# Patient Record
Sex: Female | Born: 2000 | Race: White | Hispanic: No | Marital: Married | Smoking: Never smoker
Health system: Southern US, Community
[De-identification: ages and names within clinical notes are randomized; demographics above are authoritative.]

## PROBLEM LIST (undated history)

## (undated) DIAGNOSIS — G43909 Migraine, unspecified, not intractable, without status migrainosus: Secondary | ICD-10-CM

## (undated) DIAGNOSIS — R112 Nausea with vomiting, unspecified: Secondary | ICD-10-CM

## (undated) DIAGNOSIS — F419 Anxiety disorder, unspecified: Secondary | ICD-10-CM

## (undated) DIAGNOSIS — G90A Postural orthostatic tachycardia syndrome (POTS): Secondary | ICD-10-CM

## (undated) DIAGNOSIS — F32A Depression, unspecified: Secondary | ICD-10-CM

## (undated) DIAGNOSIS — Z9889 Other specified postprocedural states: Secondary | ICD-10-CM

## (undated) HISTORY — PX: ELBOW SURGERY: SHX618

## (undated) HISTORY — PX: ADENOIDECTOMY: SHX5191

## (undated) HISTORY — PX: KNEE SURGERY: SHX244

## (undated) HISTORY — DX: Postural orthostatic tachycardia syndrome (POTS): G90.A

## (undated) HISTORY — PX: TONSILLECTOMY: SUR1361

---
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
## 2019-02-27 DIAGNOSIS — M25362 Other instability, left knee: Secondary | ICD-10-CM | POA: Insufficient documentation

## 2019-04-07 LAB — FETAL NONSTRESS TEST

## 2019-08-19 ENCOUNTER — Encounter: Payer: Medicaid Other | Admitting: Obstetrics and Gynecology

## 2019-10-14 ENCOUNTER — Emergency Department: Payer: Medicaid Other

## 2019-10-14 ENCOUNTER — Emergency Department
Admission: EM | Admit: 2019-10-14 | Discharge: 2019-10-14 | Disposition: A | Discharge status: Home / Self Care | Payer: Medicaid Other | Attending: Emergency Medicine | Admitting: Emergency Medicine

## 2019-10-14 ENCOUNTER — Encounter: Payer: Self-pay | Admitting: Emergency Medicine

## 2019-10-14 ENCOUNTER — Other Ambulatory Visit: Payer: Self-pay

## 2019-10-14 DIAGNOSIS — O218 Other vomiting complicating pregnancy: Secondary | ICD-10-CM | POA: Diagnosis not present

## 2019-10-14 DIAGNOSIS — O368390 Maternal care for abnormalities of the fetal heart rate or rhythm, unspecified trimester, not applicable or unspecified: Secondary | ICD-10-CM

## 2019-10-14 DIAGNOSIS — Z7722 Contact with and (suspected) exposure to environmental tobacco smoke (acute) (chronic): Secondary | ICD-10-CM | POA: Insufficient documentation

## 2019-10-14 DIAGNOSIS — Z3A01 Less than 8 weeks gestation of pregnancy: Secondary | ICD-10-CM | POA: Insufficient documentation

## 2019-10-14 DIAGNOSIS — O26899 Other specified pregnancy related conditions, unspecified trimester: Secondary | ICD-10-CM

## 2019-10-14 DIAGNOSIS — O26891 Other specified pregnancy related conditions, first trimester: Secondary | ICD-10-CM | POA: Diagnosis present

## 2019-10-14 DIAGNOSIS — Z79899 Other long term (current) drug therapy: Secondary | ICD-10-CM | POA: Insufficient documentation

## 2019-10-14 DIAGNOSIS — R112 Nausea with vomiting, unspecified: Secondary | ICD-10-CM

## 2019-10-14 LAB — COMPREHENSIVE METABOLIC PANEL
ALT: 20 U/L (ref 0–44)
AST: 23 U/L (ref 15–41)
Albumin: 4.9 g/dL (ref 3.5–5.0)
Alkaline Phosphatase: 71 U/L (ref 38–126)
Anion gap: 10 (ref 5–15)
BUN: 9 mg/dL (ref 6–20)
CO2: 23 mmol/L (ref 22–32)
Calcium: 9.5 mg/dL (ref 8.9–10.3)
Chloride: 103 mmol/L (ref 98–111)
Creatinine, Ser: 0.63 mg/dL (ref 0.44–1.00)
GFR calc Af Amer: >60 mL/min (ref >60)
GFR calc non Af Amer: >60 mL/min (ref >60)
Glucose, Bld: 98 mg/dL (ref 70–99)
Potassium: 3.4 mmol/L — ABNORMAL LOW (ref 3.5–5.1)
Sodium: 136 mmol/L (ref 135–145)
Total Bilirubin: 1 mg/dL (ref 0.3–1.2)
Total Protein: 7.6 g/dL (ref 6.5–8.1)

## 2019-10-14 LAB — CBC
HCT: 40.5 % (ref 36.0–46.0)
Hemoglobin: 13.6 g/dL (ref 12.0–15.0)
MCH: 30 pg (ref 26.0–34.0)
MCHC: 33.6 g/dL (ref 30.0–36.0)
MCV: 89.2 fL (ref 80.0–100.0)
Platelets: 269 10*3/uL (ref 150–400)
RBC: 4.54 MIL/uL (ref 3.87–5.11)
RDW: 12.5 % (ref 11.5–15.5)
WBC: 9.7 10*3/uL (ref 4.0–10.5)
nRBC: 0 % (ref 0.0–0.2)

## 2019-10-14 LAB — URINALYSIS, COMPLETE (UACMP) WITH MICROSCOPIC
Bilirubin Urine: NEGATIVE
Glucose, UA: NEGATIVE mg/dL
Ketones, ur: 80 mg/dL — AB
Leukocytes,Ua: NEGATIVE
Nitrite: NEGATIVE
Protein, ur: NEGATIVE mg/dL
Specific Gravity, Urine: 1.019 (ref 1.005–1.030)
pH: 8 (ref 5.0–8.0)

## 2019-10-14 LAB — HCG, QUANTITATIVE, PREGNANCY: hCG, Beta Chain, Quant, S: 44758 m[IU]/mL — ABNORMAL HIGH (ref <5)

## 2019-10-14 LAB — LIPASE, BLOOD: Lipase: 29 U/L (ref 11–51)

## 2019-10-14 LAB — POCT PREGNANCY, URINE: Preg Test, Ur: POSITIVE — AB

## 2019-10-14 MED ORDER — METOCLOPRAMIDE HCL 10 MG PO TABS: 10.0000 mg | ORAL_TABLET | Freq: Four times a day (QID) | ORAL | 1 refills | Status: DC | PRN

## 2019-10-14 MED ORDER — ACETAMINOPHEN 500 MG PO TABS
1000.0000 mg | ORAL_TABLET | Freq: Once | ORAL | Status: DC
  Filled 2019-10-14: qty 2

## 2019-10-14 NOTE — ED Triage Notes (Signed)
Pt recently found out she was pregnant. Virtual visit with primary care and was dx with stomach bug. Sent her by [facility] for a work up. As she has not been seen by them.

## 2019-10-14 NOTE — ED Provider Notes (Signed)
[facility] Emergency Department Provider Note ____________________________________________   First MD Initiated Contact with Patient 10/14/19 1520     (approximate)  I have reviewed the triage vital signs and the nursing notes.  HISTORY  Chief Complaint Abdominal Pain (Pregnacy related Nausea and vomitiing) and Nausea   HPI Adriana Kerr is a 19 y.o. femalewho presents to the ED for evaluation of nausea and vomiting of pregnancy.  Chart review indicates no relevant medical history. Patient reports she is a G3 P0 at about [redacted] weeks gestation.  Reports her previous to the gestations ended with a spontaneous abortion at less than [redacted] weeks gestation.  Patient reports her LMP was early July and that she has had multiple positive home pregnancy tests over the past 1 week. Patient has not seen OB/GYN or gotten an ultrasound during this gestation yet.  Patient reports about 1-1.5 weeks of poor p.o. intake and morning sickness.  She reports extreme nausea and occasional vomiting of nonbloody nonbilious emesis, up to five episodes in the morning, each morning for the past 1 week.  She reports difficulty keeping anything down throughout the morning, but feeling better each afternoon.  She denies any abdominal pain, diarrhea, vaginal bleeding, dysuria, vaginal discharge, syncope, fevers.   Patient reports trying pyridoxine/vitamin B6 with minimal improvement of her nausea/vomiting.  She reports inability to take Unisom due to it making her too sleepy, similarly Phenergan makes her too sleepy to do her job.    Past Medical History:  Diagnosis Date  . Migraines     There are no problems to display for this patient.   Past Surgical History:  Procedure Laterality Date  . ADENOIDECTOMY    . ELBOW SURGERY    . KNEE SURGERY    . TONSILLECTOMY      Prior to Admission medications   Medication Sig Start Date End Date Taking? Authorizing Provider   brompheniramine-pseudoephedrine-DM 30-2-10 MG/5ML syrup Take 5 mLs by mouth 4 (four) times daily as needed. 02/04/16   Joni Reining, PA-C  hydrOXYzine (VISTARIL) 25 MG capsule Take 1 capsule (25 mg total) by mouth 3 (three) times daily as needed. 11/11/15   Beers, Charmayne Sheer, PA-C  metoCLOPramide (REGLAN) 10 MG tablet Take 1 tablet (10 mg total) by mouth every 6 (six) hours as needed for nausea or vomiting. 10/14/19 11/13/19  Delton Prairie, MD  predniSONE (DELTASONE) 10 MG tablet Take 1 tablet (10 mg total) by mouth daily. 6,5,4,3,2,1 six day taper 04/22/16   Evon Slack, PA-C  predniSONE (STERAPRED UNI-PAK 21 TAB) 10 MG (21) TBPK tablet Take by mouth daily. Take 6 tabs by mouth daily  for 2 days, then 5 tabs for 2 days, then 4 tabs for 2 days, then 3 tabs for 2 days, 2 tabs for 2 days, then 1 tab by mouth daily for 2 days 09/01/16   Lavera Guise, MD    Allergies Flexeril [cyclobenzaprine], Amoxicillin, Tylenol with codeine #3 [acetaminophen-codeine], and Augmentin [amoxicillin-pot clavulanate]  History reviewed. No pertinent family history.  Social History Social History   Tobacco Use  . Smoking status: Passive Smoke Exposure - Never Smoker  . Smokeless tobacco: Never Used  Substance Use Topics  . Alcohol use: No  . Drug use: Never    Review of Systems  Constitutional: No fever/chills Eyes: No visual changes. ENT: No sore throat. Cardiovascular: Denies chest pain. Respiratory: Denies shortness of breath. Gastrointestinal: No abdominal pain.  No diarrhea.  No constipation.  Positive for  nausea and vomiting. Genitourinary: Negative for dysuria. Musculoskeletal: Negative for back pain. Skin: Negative for rash. Neurological: Negative for headaches, focal weakness or numbness.   ____________________________________________   PHYSICAL EXAM:  VITAL SIGNS: Vitals:   10/14/19 1452 10/14/19 1727  BP: 126/76 123/78  Pulse: 84 79  Resp: 20 18  Temp:    SpO2: 99% 100%       Constitutional: Alert and oriented. Well appearing and in no acute distress. Eyes: Conjunctivae are normal. PERRL. EOMI. Head: Atraumatic. Nose: No congestion/rhinnorhea. Mouth/Throat: Mucous membranes are moist.  Oropharynx non-erythematous. Neck: No stridor. No cervical spine tenderness to palpation. Cardiovascular: Normal rate, regular rhythm. Grossly normal heart sounds.  Good peripheral circulation. Respiratory: Normal respiratory effort.  No retractions. Lungs CTAB. Gastrointestinal: Soft , nondistended, nontender to palpation. No abdominal bruits. No CVA tenderness. Musculoskeletal: No lower extremity tenderness nor edema.  No joint effusions. No signs of acute trauma. Neurologic:  Normal speech and language. No gross focal neurologic deficits are appreciated. No gait instability noted. Skin:  Skin is warm, dry and intact. No rash noted. Psychiatric: Mood and affect are normal. Speech and behavior are normal.  ____________________________________________   LABS (all labs ordered are listed, but only abnormal results are displayed)  Labs Reviewed  COMPREHENSIVE METABOLIC PANEL - Abnormal; Notable for the following components:      Result Value   Potassium 3.4 (*)    All other components within normal limits  URINALYSIS, COMPLETE (UACMP) WITH MICROSCOPIC - Abnormal; Notable for the following components:   Color, Urine YELLOW (*)    APPearance CLOUDY (*)    Hgb urine dipstick SMALL (*)    Ketones, ur 80 (*)    Bacteria, UA RARE (*)    All other components within normal limits  HCG, QUANTITATIVE, PREGNANCY - Abnormal; Notable for the following components:   hCG, Beta Chain, Quant, S 44,758 (*)    All other components within normal limits  POCT PREGNANCY, URINE - Abnormal; Notable for the following components:   Preg Test, Ur POSITIVE (*)    All other components within normal limits  LIPASE, BLOOD  CBC  POC URINE PREG, ED     ____________________________________________  RADIOLOGY  ED MD interpretation:    Official radiology report(s): US OB LESS THAN 14 WEEKS WITH OB TRANSVAGINAL  Result Date: 10/14/2019 CLINICAL DATA:  Abdominal pain EXAM: OBSTETRIC <14 WK [place] AND TRANSVAGINAL OB US TECHNIQUE: Both transabdominal and transvaginal ultrasound examinations were performed for complete evaluation of the gestation as well as the maternal uterus, adnexal regions, and pelvic cul-de-sac. Transvaginal technique was performed to assess early pregnancy. COMPARISON:  None. FINDINGS: Intrauterine gestational sac: Single Yolk sac:  Visualized Embryo:  Visualized. Cardiac Activity: Visualized. Heart Rate: 109 bpm MSD:   mm    w     d CRL:  5.1 mm   6 w   2 d                  [place] EDC: 06/06/2020 Subchorionic hemorrhage:  None visualized. Maternal uterus/adnexae: No adnexal mass or free fluid. IMPRESSION: Six week 2 day intrauterine pregnancy. Fetal heart rate 109 beats per minute. No acute maternal findings. Electronically Signed   By: Charlett Nose M.D.   On: 10/14/2019 17:02    ____________________________________________   PROCEDURES and INTERVENTIONS  Procedure(s) performed (including Critical Care):  Procedures  Medications  acetaminophen (TYLENOL) tablet 1,000 mg (1,000 mg Oral Refused 10/14/19 1605)    ____________________________________________   MDM / ED COURSE  19 year old female G3, P0 at about [redacted] weeks gestation presenting with nausea/vomiting of her first trimester, found to have a bradycardic fetus, and ultimately amenable to outpatient management.  Normal vital signs on room air.  Exam reassuring without evidence of acute pathology.  She is well-appearing without distress and has a benign abdominal exam.  Blood work reassuring with appropriately elevated beta quant, and otherwise without significant derangements.  TVUS demonstrates an IUP, though unfortunately the heart rate is 109 bpm.  Educated the  patient that fetal bradycardia is not reassuring and this may mean that this gestation will not be viable, but that the fetus is appropriately positioned and that the patient looks well.  We discussed the importance of OB/GYN follow-up and we discussed return precautions for the ED that may suggest a possible miscarriage, and she expresses understanding and agreement.  She follows up with her OB/GYN next week to establish care.  Patient medically stable for discharge home. ____________________________________________   FINAL CLINICAL IMPRESSION(S) / ED DIAGNOSES  Final diagnoses:  Less than [redacted] weeks gestation of pregnancy  Non-intractable vomiting with nausea, unspecified vomiting type  Fetal bradycardia, antepartum condition or complication     ED Discharge Orders         Ordered    metoCLOPramide (REGLAN) 10 MG tablet  Every 6 hours PRN        10/14/19 1721           Koraline Phillipson   Note:  This document was prepared using Conservation officer, historic buildings and may include unintentional dictation errors.   Delton Prairie, MD 10/14/19 [id number]

## 2019-10-14 NOTE — Discharge Instructions (Signed)
You are being prescribed a medication called Reglan to help with your nausea.  This medication is safe in pregnancy.  Take 1 tablet by mouth at a time, up to 4 times per day as needed for nausea.  It would also be safe to add Unisom/vitamin B6 (pyridoxine).  Unisom is often marketed as a sleep aid medication.  Please follow-up with your OB/GYN doctor. The ultrasound we did here of baby demonstrates a relatively slow heart rate at 110 beats per minutes.  While it is still very early, this may represent an early gestation that is not viable.  If you develop any vaginal bleeding, fevers or worsening pain, please return to the ED

## 2020-01-26 ENCOUNTER — Ambulatory Visit
Admission: RE | Admit: 2020-01-26 | Discharge: 2020-01-26 | Disposition: A | Discharge status: Home / Self Care | Payer: Medicaid Other | Source: Ambulatory Visit | Attending: Obstetrics and Gynecology | Admitting: Obstetrics and Gynecology

## 2020-01-26 ENCOUNTER — Other Ambulatory Visit: Payer: Self-pay

## 2020-01-26 ENCOUNTER — Other Ambulatory Visit: Payer: Self-pay | Admitting: Obstetrics and Gynecology

## 2020-01-26 DIAGNOSIS — Z3A00 Weeks of gestation of pregnancy not specified: Secondary | ICD-10-CM | POA: Insufficient documentation

## 2020-01-26 DIAGNOSIS — O210 Mild hyperemesis gravidarum: Secondary | ICD-10-CM | POA: Insufficient documentation

## 2020-01-26 MED ORDER — ONDANSETRON HCL 4 MG/2ML IJ SOLN
INTRAMUSCULAR | Status: AC
  Administered 2020-01-26: 4 mg via INTRAVENOUS
  Filled 2020-01-26: qty 2

## 2020-01-26 MED ORDER — SODIUM CHLORIDE 0.9 % IV SOLN
INTRAVENOUS | Status: DC | PRN
  Administered 2020-01-26: 500 mL via INTRAVENOUS

## 2020-01-26 MED ORDER — THIAMINE HCL 100 MG/ML IJ SOLN
Freq: Once | INTRAVENOUS | Status: AC
  Filled 2020-01-26: qty 1000

## 2020-01-26 MED ORDER — ONDANSETRON HCL 4 MG/2ML IJ SOLN: 4.0000 mg | Freq: Once | INTRAMUSCULAR | Status: AC

## 2020-01-26 NOTE — Progress Notes (Signed)
Pt with HG at 21wks, to SDS for IV hydration and antiemetic today

## 2020-01-29 ENCOUNTER — Observation Stay
Admission: EM | Admit: 2020-01-29 | Discharge: 2020-01-29 | Disposition: A | Discharge status: Home / Self Care | Payer: Medicaid Other | Attending: Obstetrics and Gynecology | Admitting: Obstetrics and Gynecology

## 2020-01-29 ENCOUNTER — Other Ambulatory Visit: Payer: Self-pay

## 2020-01-29 DIAGNOSIS — Z3490 Encounter for supervision of normal pregnancy, unspecified, unspecified trimester: Secondary | ICD-10-CM

## 2020-01-29 DIAGNOSIS — Z3A22 22 weeks gestation of pregnancy: Secondary | ICD-10-CM | POA: Insufficient documentation

## 2020-01-29 DIAGNOSIS — O368120 Decreased fetal movements, second trimester, not applicable or unspecified: Principal | ICD-10-CM | POA: Insufficient documentation

## 2020-01-29 DIAGNOSIS — O0993 Supervision of high risk pregnancy, unspecified, third trimester: Secondary | ICD-10-CM

## 2020-01-29 DIAGNOSIS — Z3482 Encounter for supervision of other normal pregnancy, second trimester: Secondary | ICD-10-CM

## 2020-01-29 HISTORY — DX: Anxiety disorder, unspecified: F41.9

## 2020-01-29 HISTORY — DX: Nausea with vomiting, unspecified: R11.2

## 2020-01-29 HISTORY — DX: Other specified postprocedural states: Z98.890

## 2020-01-29 NOTE — Discharge Summary (Signed)
Triage Visit with NST    Adriana Kerr is a 19 y.o. G1P0. She is at [redacted]w[redacted]d gestation.  Indication: Decreased fetal movement, no movement today when she usually does feel it, and nothing she can do has been able to make it better  S: Resting comfortably. no CTX, no VB.    Ht 5\' 3"  (1.6 m)   Wt 68.9 kg   LMP 08/26/2019   BMI 26.93 kg/m  No results found for this or any previous visit (from the past 48 hour(s)).   Gen: NAD, AAOx3      Abd: FNTTP      Ext: Non-tender, Nonedmeatous    FHT: 150 TOCO: quiet SVE: deferred  Beside ultrasound: active fetal movement, heartbeat noted. Placenta in anterior lower uterine segment, normal fluid subjectively, DVP 6cm   A/P:  19 y.o. G1P0 [redacted]w[redacted]d with concerns for decreased fetal movement, now resolved.   Labor: not present.   Fetal Wellbeing: Doppler and bedside ultrasound are Reassuring   D/c home stable, precautions reviewed, follow-up as scheduled.

## 2020-01-29 NOTE — OB Triage Note (Signed)
Pt Adriana Kerr 19 y.o. presents to the ED complaining of decreased fetal movement. Pt is a G1P0 at [redacted]w[redacted]d . Pt denies signs and symptons consistent with rupture of membranes or active vaginal bleeding. Pt denies contractions and states concern regarding fetal movement. External TOCO applied to non-tender abdomen and assessing. Initial FHR is 150. Vital signs obtained and within normal limits. Provider notified of pt.

## 2020-02-17 NOTE — Addendum Note (Signed)
Encounter addended by: Rosalita Chessman, RN on: 02/17/2020 2:28 PM  Actions taken: Charge Capture section accepted

## 2020-02-21 NOTE — L&D Delivery Note (Signed)
Date of delivery: 05/17/2020 Estimated Date of Delivery: 06/06/20 Patient's last menstrual period was 08/26/2019. EGA: [redacted]w[redacted]d  Delivery Note At 1:11 PM a viable female was delivered via Vaginal, Spontaneous Presentation: Right Occiput Anterior   APGAR: 9, 9; Weight: 2830 g, 6 pounds 3.8 ounces  Placenta status: Spontaneous, Intact.   Cord: 3 vessels with the following complications: marginal insertion  Cord pH: NA  Mom pushed well to deliver a viable female infant.  The head followed by shoulders, which delivered without difficulty, and the rest of the body.  No nuchal cord noted.  Baby to mom's chest.  Cord clamped and cut after 8 min delay.  No cord blood obtained.  Placenta delivered spontaneously, intact, with a 3-vessel cord.   All counts correct.  Hemostasis obtained with IV pitocin and fundal massage.   Anesthesia: Epidural Episiotomy: None Lacerations: None Suture Repair: NA Est. Blood Loss (mL): 300  Mom to postpartum.  Baby to Couplet care / Skin to Skin.  Tresea Mall, CNM 05/17/2020, 1:53 PM

## 2020-02-22 ENCOUNTER — Other Ambulatory Visit: Payer: Self-pay

## 2020-02-22 ENCOUNTER — Emergency Department
Admission: EM | Admit: 2020-02-22 | Discharge: 2020-02-22 | Discharge status: Absent without leave | Payer: Medicaid Other

## 2020-02-22 DIAGNOSIS — O210 Mild hyperemesis gravidarum: Secondary | ICD-10-CM

## 2020-02-24 ENCOUNTER — Observation Stay
Admission: EM | Admit: 2020-02-24 | Discharge: 2020-02-24 | Disposition: A | Discharge status: Home / Self Care | Payer: Medicaid Other | Attending: Obstetrics and Gynecology | Admitting: Obstetrics and Gynecology

## 2020-02-24 ENCOUNTER — Other Ambulatory Visit: Payer: Self-pay

## 2020-02-24 ENCOUNTER — Encounter: Payer: Self-pay | Admitting: Obstetrics and Gynecology

## 2020-02-24 ENCOUNTER — Ambulatory Visit (INDEPENDENT_AMBULATORY_CARE_PROVIDER_SITE_OTHER): Payer: Medicaid Other | Admitting: Obstetrics and Gynecology

## 2020-02-24 VITALS — BP 112/70 | Ht 63.0 in | Wt 152.4 lb

## 2020-02-24 DIAGNOSIS — O219 Vomiting of pregnancy, unspecified: Secondary | ICD-10-CM | POA: Diagnosis present

## 2020-02-24 DIAGNOSIS — Z3A25 25 weeks gestation of pregnancy: Secondary | ICD-10-CM

## 2020-02-24 DIAGNOSIS — O210 Mild hyperemesis gravidarum: Secondary | ICD-10-CM | POA: Diagnosis not present

## 2020-02-24 DIAGNOSIS — O99612 Diseases of the digestive system complicating pregnancy, second trimester: Secondary | ICD-10-CM

## 2020-02-24 DIAGNOSIS — Z3A26 26 weeks gestation of pregnancy: Secondary | ICD-10-CM

## 2020-02-24 DIAGNOSIS — Z3482 Encounter for supervision of other normal pregnancy, second trimester: Secondary | ICD-10-CM

## 2020-02-24 DIAGNOSIS — K920 Hematemesis: Secondary | ICD-10-CM | POA: Diagnosis not present

## 2020-02-24 DIAGNOSIS — O4402 Placenta previa specified as without hemorrhage, second trimester: Secondary | ICD-10-CM

## 2020-02-24 DIAGNOSIS — Z3480 Encounter for supervision of other normal pregnancy, unspecified trimester: Secondary | ICD-10-CM | POA: Insufficient documentation

## 2020-02-24 LAB — POCT URINALYSIS DIPSTICK
Bilirubin, UA: NEGATIVE
Blood, UA: POSITIVE
Glucose, UA: POSITIVE — AB
Ketones, UA: POSITIVE
Nitrite, UA: NEGATIVE
Protein, UA: NEGATIVE
Spec Grav, UA: 1.025 (ref 1.010–1.025)
Urobilinogen, UA: 0.2 E.U./dL
pH, UA: 7.5 (ref 5.0–8.0)

## 2020-02-24 LAB — CBC
HCT: 34.3 % — ABNORMAL LOW (ref 36.0–46.0)
Hemoglobin: 11.3 g/dL — ABNORMAL LOW (ref 12.0–15.0)
MCH: 30.2 pg (ref 26.0–34.0)
MCHC: 32.9 g/dL (ref 30.0–36.0)
MCV: 91.7 fL (ref 80.0–100.0)
Platelets: 158 10*3/uL (ref 150–400)
RBC: 3.74 MIL/uL — ABNORMAL LOW (ref 3.87–5.11)
RDW: 12.6 % (ref 11.5–15.5)
WBC: 4.4 10*3/uL (ref 4.0–10.5)
nRBC: 0 % (ref 0.0–0.2)

## 2020-02-24 LAB — COMPREHENSIVE METABOLIC PANEL
ALT: 20 U/L (ref 0–44)
AST: 27 U/L (ref 15–41)
Albumin: 3.1 g/dL — ABNORMAL LOW (ref 3.5–5.0)
Alkaline Phosphatase: 74 U/L (ref 38–126)
Anion gap: 9 (ref 5–15)
BUN: <5 mg/dL — ABNORMAL LOW (ref 6–20)
CO2: 23 mmol/L (ref 22–32)
Calcium: 9.4 mg/dL (ref 8.9–10.3)
Chloride: 107 mmol/L (ref 98–111)
Creatinine, Ser: 0.47 mg/dL (ref 0.44–1.00)
GFR, Estimated: >60 mL/min (ref >60)
Glucose, Bld: 108 mg/dL — ABNORMAL HIGH (ref 70–99)
Potassium: 3.7 mmol/L (ref 3.5–5.1)
Sodium: 139 mmol/L (ref 135–145)
Total Bilirubin: 0.5 mg/dL (ref 0.3–1.2)
Total Protein: 6.4 g/dL — ABNORMAL LOW (ref 6.5–8.1)

## 2020-02-24 LAB — TYPE AND SCREEN
ABO/RH(D): A POS
Antibody Screen: NEGATIVE

## 2020-02-24 LAB — ABO/RH: ABO/RH(D): A POS

## 2020-02-24 MED ORDER — M.V.I. ADULT IV INJ
INTRAVENOUS | Status: DC
  Filled 2020-02-24: qty 10

## 2020-02-24 MED ORDER — OMEPRAZOLE 20 MG PO CPDR: 20.0000 mg | DELAYED_RELEASE_CAPSULE | Freq: Two times a day (BID) | ORAL | 11 refills | Status: DC

## 2020-02-24 NOTE — OB Triage Note (Signed)
[facility] with hyperemesis. Pt states that she has been throwing up blood about 2-3 days ago. She states she is still throwing up, but blood is not in it now. Pt stated that in the past 24 hours, she has vomited about 5 times. Pt states her OB office sent her over to be evaluated. Pt denies vaginal bleeding, LOF, and ctx. Pt states positive FM. Monitors applied and assessing. Initial FHT 145.

## 2020-02-24 NOTE — Progress Notes (Signed)
[name]    Chief Complaint: Desires "transfer" of prenatal care   History of Present Illness: Patient is a 20 y.o. [id number] Not Hispanic or Latino female, presents to transfer her prenatal care at 25w2 gestation as established by 7w [place]. Patient's last menstrual period was 08/26/2019. Cycles are "very irregular" according to patient, so first trimester [place] was utilized to establish dates at previous practice.  During this pregnancy patient reports continuous sx of nausea/vomiting. Patient reports outside of pregnancy she has had n/v, migraines, dizziness. She was previously to be evaluated for POTS prior to this pregnancy. Patient reports that since early pregnancy (around 4 wks) she has experienced daily vomiting and nausea. Patient reports some days she may have one episode of emesis while most days she may have 4-5 episodes. Patient also had noted a change in bowel movements, with frequent loose stools. Patient has undergone several therapies to attempt to control her N/V including diclegis (did not work per patient), phenergan (patient states she is unable to tolerate drowsiness associated with it), Reglan (per patient- triggered migraines), scopolamine patch (per patient- triggered migraines). Patient has completed a prednisone taper at 22w. Patient states she has been on zofran (also took prior to pregnancy for nausea) and it has provided very little relief.  Patient reports she has had positive effective with IV hydration. Today, patient reports experiencing nausea about 20 hours a day with 4-5 episodes of emesis per day for the last week. Patient reports intermittent periods of blood in emesis throughout this pregnancy, most recently within the last week with coffee ground type emesis noted. Patient states she has only been able to tolerate Gatorade and water for the last "4 days." Patient reports occasional lightheadedness and dizziness. Patient report daily THC use (smoking) to  control n/v. 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  Since her LMP, she admits to the use of tobacco products  no She claims she has lost 7 pounds since the start of her pregnancy. (From wt at [facility]B at [facility] - per patient she feels as if she has lost closer to 20 lbs. According to 8/21 annual visit - patient was being seen for unintended wt loss prior to pregnancy dx) There are cats in the home in the home  no  She admits close contact with children on a regular basis  no  She has had chicken pox in the past no She has had Tuberculosis exposures, symptoms, or previously tested positive for TB   no Current or past history of domestic violence. no  Genetic Screening/Teratology Counseling: (Includes patient, baby's father, or anyone in either family with:)   1. Patient's age >/= 28 at [facility]  no 2. Thalassemia ([place], [place], Mediterranean, or Asian background): MCV<80  no 3. Neural tube defect (meningomyelocele, spina bifida, anencephaly)  no 4. Congenital heart defect  no  5. Down syndrome  no 6. Tay-Sachs (Jewish, [place]  no 7. Canavan's Disease  no 8. Sickle cell disease or trait (African)  no  9. Hemophilia or other blood disorders  yes  10. Muscular dystrophy  no  11. Cystic fibrosis  no  12. Huntington's Chorea  no  13. Mental retardation/autism  yes 14. Other inherited genetic or chromosomal disorder  no 15. Maternal metabolic disorder (DM, PKU, etc)  no 16. Patient or FOB with a child with a birth defect not listed  above no  16a. Patient or FOB with a birth defect themselves no 17. Recurrent pregnancy loss, or stillbirth  no  18. Any medications since LMP other than prenatal vitamins (include vitamins, supplements, OTC meds, drugs, alcohol)  yes 19. Any other genetic/environmental exposure to discuss  no  Infection History:   1. Lives with someone with TB  or TB exposed  no  2. Patient or partner has history of genital herpes  no 3. Rash or viral illness since LMP  no 4. History of STI (GC, CT, HPV, syphilis, HIV)  no 5. History of recent travel :  no  Other pertinent information:  Currently unemployed. Lives with husband, Adriana Kerr.    Review of Systems:10 point review of systems negative unless otherwise noted in HPI  Past Medical History:  Patient Active Problem List   Diagnosis Date Noted  . Hyperemesis affecting pregnancy, antepartum 02/24/2020  . Encounter for supervision of other normal pregnancy, second trimester 01/29/2020    Clinic [facility] (NOB labs at [facility] on 11/13/19)  Dating  7w [place] Blood type: --/--/A POS (01/04 1907)   Genetic Screen  NIPS: negx3, xx Antibody:NEG (01/04 1907)  Anatomic US  anatomy wnl - marginal placental previa - needs repeat at 28 weeks Rubella:   immune  Varicella: immune  GTT  Third trimester: desires glucose monitoring as alternative option RPR:   non-reactive  Rhogam  n/a HBsAg:   non-reactive  TDaP vaccine                       Flu Shot: declines HIV:   negative  Baby Food  breast              GBS:   Contraception  BTL if cesarean Pap: n/a (<21 yo)  CBB     CS/VBAC    Support Person  Ryan        . 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 Migraine without status migrainosus, not intractable 11/01/2016  . 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 Patellar instability of right knee 01/05/2014  . Patellofemoral dysfunction of right knee 11/18/2013    Past Surgical History:  Past Surgical History:  Procedure Laterality Date  . ADENOIDECTOMY    . ELBOW SURGERY    . KNEE SURGERY    . TONSILLECTOMY      Gynecologic History: Patient's last menstrual period was 08/26/2019.  Obstetric History: G3P0020  Family History:  History reviewed. No pertinent family history.  Social History:  Social History   Socioeconomic History  . Marital status: Married     Spouse name: Adriana Kerr   . Number of children: Not on file  . Years of education: Not on file  . Highest education level: Not on file  Occupational History  . Not on file  Tobacco Use  . Smoking status: Passive Smoke Exposure - Never Smoker  . Smokeless tobacco: Never Used  Substance and Sexual Activity  . Alcohol use: No  . Drug use: Yes    Types: Marijuana    Comment: Last this morning 01/29/20  . Sexual activity: Yes    Birth control/protection: None  Other Topics Concern  . Not on file  Social History Narrative  . Not on file   Social Determinants of Health   Financial Resource Strain: Not on file  Food Insecurity: Not on file  Transportation Needs: Not on file  Physical Activity: Not on file  Stress: Not on file  Social Connections: Not on file  Intimate Partner Violence: Not on file    Allergies:  Allergies  Allergen Reactions  . Flexeril [Cyclobenzaprine]     Agitation and disorientation  . Amoxicillin Hives  . Tylenol With Codeine #3 [Acetaminophen-Codeine] Other (See Comments)    emesis  . Augmentin [Amoxicillin-Pot Clavulanate] Rash    Medications: Prior to Admission medications   Medication Sig Start Date End Date Taking? Authorizing Provider  famotidine (PEPCID) 20 MG tablet Take by mouth. Patient not taking: Reported on 02/24/2020 01/21/20 04/20/20 Yes [provider]  brompheniramine-pseudoephedrine-DM 30-2-10 MG/5ML syrup Take 5 mLs by mouth 4 (four) times daily as needed. Patient not taking: No sig reported 02/04/16   Joni Reining, PA-C  hydrOXYzine (VISTARIL) 25 MG capsule Take 1 capsule (25 mg total) by mouth 3 (three) times daily as needed. Patient not taking: Reported on 02/24/2020 11/11/15   Beers, Charmayne Sheer, PA-C  metoCLOPramide (REGLAN) 10 MG tablet Take 1 tablet (10 mg total) by mouth every 6 (six) hours as needed for nausea or vomiting. 10/14/19 11/13/19  Delton Prairie, MD  ondansetron (ZOFRAN) 8 MG tablet Take 8 mg by mouth every 8 (eight)  hours as needed for nausea or vomiting.    [provider]  predniSONE (DELTASONE) 10 MG tablet Take 1 tablet (10 mg total) by mouth daily. 6,5,4,3,2,1 six day taper Patient not taking: Reported on 02/24/2020 04/22/16   Evon Slack, PA-C  predniSONE (STERAPRED UNI-PAK 21 TAB) 10 MG (21) TBPK tablet Take by mouth daily. Take 6 tabs by mouth daily  for 2 days, then 5 tabs for 2 days, then 4 tabs for 2 days, then 3 tabs for 2 days, 2 tabs for 2 days, then 1 tab by mouth daily for 2 days Patient not taking: Reported on 02/24/2020 09/01/16   Lavera Guise, MD    Physical Exam Vitals: Blood pressure 112/70, HR: 105 bmp, height 5\' 3"  (1.6 m), weight 152 lb 6.4 oz (69.1 kg), last menstrual period 08/26/2019.  General: NAD HEENT: normocephalic, anicteric Thyroid: no enlargement, no palpable nodules Pulmonary: No increased work of breathing Cardiovascular: RRR, distal pulses 2+ Abdomen: NABS, soft, non-tender, non-distended.  Umbilicus without lesions.  No hepatomegaly, splenomegaly or masses palpable. No evidence of hernia  Genitourinary: deferred - completed and documented at NOB at [facility] Extremities: no edema, erythema, or tenderness Neurologic: Grossly intact Psychiatric: mood appropriate, affect full   Assessment: 20 y.o. G3P0020 at [redacted]w[redacted]d presenting to initiate prenatal care  Plan: 1) Avoid alcoholic beverages. 2) Patient encouraged not to smoke.  3) Discontinue the use of all non-medicinal drugs and chemicals.  4) Take prenatal vitamins daily.  5) Nutrition, food safety (fish, cheese advisories, and high nitrite foods) and exercise discussed. 6) Hospital and practice style discussed with cross coverage system.  7) Genetic Screening, such as with 1st Trimester Screening, cell free fetal DNA, AFP testing, and Ultrasound, as well as with amniocentesis and CVS as appropriate, is discussed with patient. At the conclusion of today's visit patient requested genetic testing - has completed  NIPTs - desires Inheritest  8) N/V in pregnancy: +ketones in UA, mildly tachycardic, recent coffee ground emesis, weight loss throughout pregnancy, only tolerating minimal PO hydration over last several days.  Discussed with patient options for outpatient evaluation vs L&D triage for HG including lab work and possible GI consult. Patient has exhausted options for outpatient medication management and may benefit from further evaluation and management on L&D. Patient stated understanding.  9)  Reviewed labs from [facility] - patient will need GC/CT testing at next visit.  10) Marginal placental previa - f/u US at 28 wks  Orlie Pollen, CNM, MSN [facility], [place] 02/24/2020, 9:37 PM

## 2020-02-24 NOTE — H&P (Signed)
Adriana Kerr is an 20 y.o. female.   Chief Complaint: refractory nausea and vomiting in pregnancy HPI:   Patient reports that she has nausea outside of pregnancy.  However, when she was [redacted] weeks pregnant she began having worsening nausea, vomiting, and diarrhea.   She has not had a single day this pregnancy that she has not vomited once.   Her best weeks are when so only vomits once a day every day.   She has not been able to eat much the entire time she has been pregnant.   Recently she was vomiting multiple times a day. 3 days ago she had enough vomiting that she had blood in her vomit. She reports the blood was coffee ground in color. She has thrown up coffee ground emesis at least 11 days so far this pregnancy. The episodes of coffee ground emesis will last for 2-6 days.   Nothing has yet to help with vomiting.  She has tried a lot of regimens for her hyperemesis. No regimen has helped. She took zofran before pregnancy which did usually help with the nausea, but since being pregnant it has not provided relief.  She does have a history of chronic daily marijuana use. She has smoked marijuana during the pregnancy. She feels likes this helps the nausea.  Prior to pregnancy she used to smoke several times a day. Usually she smokes a liquid concentrate. Although the usage was more frequent she reports that it was small amounts. She says she was maybe smoking  0.2 g a day- roughly the equivalent of a joint.  In pregnancy: she has been smoking once a day generally to help with the nausea if she is not holding down food.    She was being considered for testing for POTTS syndrome prior to pregnancy because of  dizziness that she has outside of pregnancy. Sometimes taking a shower is difficult. Sometimes she is not able to walk because of the dizziness. She reports that with being pregnant she has mostly been in the bed.  Reports that 1/2 the time she has normal bowel movements, the other 1/2  loose stools. Her bowel movements are very inconsistent. She alternates between constipation and diarrhea. She has not seen blood or mucus in her stool. She has had bright orange stools, green stools and some normal brown stools.    Past Medical History:  Diagnosis Date  . Anxiety   . Migraines   . PONV (postoperative nausea and vomiting)     Past Surgical History:  Procedure Laterality Date  . ADENOIDECTOMY    . ELBOW SURGERY    . KNEE SURGERY    . TONSILLECTOMY      History reviewed. No pertinent family history. Social History:  reports that she is a non-smoker but has been exposed to tobacco smoke. She has never used smokeless tobacco. She reports current drug use. Drug: Marijuana. She reports that she does not drink alcohol.  Allergies:  Allergies  Allergen Reactions  . Flexeril [Cyclobenzaprine]     Agitation and disorientation  . Amoxicillin Hives  . Tylenol With Codeine #3 [Acetaminophen-Codeine] Other (See Comments)    emesis  . Augmentin [Amoxicillin-Pot Clavulanate] Rash    Medications Prior to Admission  Medication Sig Dispense Refill  . ondansetron (ZOFRAN) 8 MG tablet Take 8 mg by mouth every 8 (eight) hours as needed for nausea or vomiting.    . brompheniramine-pseudoephedrine-DM 30-2-10 MG/5ML syrup Take 5 mLs by mouth 4 (four) times daily as needed. (  Patient not taking: No sig reported) 120 mL 0  . famotidine (PEPCID) 20 MG tablet Take by mouth. (Patient not taking: Reported on 02/24/2020)    . hydrOXYzine (VISTARIL) 25 MG capsule Take 1 capsule (25 mg total) by mouth 3 (three) times daily as needed. (Patient not taking: Reported on 02/24/2020) 30 capsule 0  . metoCLOPramide (REGLAN) 10 MG tablet Take 1 tablet (10 mg total) by mouth every 6 (six) hours as needed for nausea or vomiting. 30 tablet 1  . predniSONE (DELTASONE) 10 MG tablet Take 1 tablet (10 mg total) by mouth daily. 6,5,4,3,2,1 six day taper (Patient not taking: Reported on 02/24/2020) 21 tablet 0  .  predniSONE (STERAPRED UNI-PAK 21 TAB) 10 MG (21) TBPK tablet Take by mouth daily. Take 6 tabs by mouth daily  for 2 days, then 5 tabs for 2 days, then 4 tabs for 2 days, then 3 tabs for 2 days, 2 tabs for 2 days, then 1 tab by mouth daily for 2 days (Patient not taking: Reported on 02/24/2020) 42 tablet 0    Results for orders placed or performed during the hospital encounter of 02/24/20 (from the past 48 hour(s))  CBC     Status: Abnormal   Collection Time: 02/24/20  7:06 PM  Result Value Ref Range   WBC 4.4 4.0 - 10.5 K/uL   RBC 3.74 (L) 3.87 - 5.11 MIL/uL   Hemoglobin 11.3 (L) 12.0 - 15.0 g/dL   HCT 25.9 (L) 56.3 - 87.5 %   MCV 91.7 80.0 - 100.0 fL   MCH 30.2 26.0 - 34.0 pg   MCHC 32.9 30.0 - 36.0 g/dL   RDW 64.3 32.9 - 51.8 %   Platelets 158 150 - 400 K/uL   nRBC 0.0 0.0 - 0.2 %    Comment: Performed at [facility], [address] [place], [place] [address]  Comprehensive metabolic panel     Status: Abnormal   Collection Time: 02/24/20  7:06 PM  Result Value Ref Range   Sodium 139 135 - 145 mmol/L   Potassium 3.7 3.5 - 5.1 mmol/L   Chloride 107 98 - 111 mmol/L   CO2 23 22 - 32 mmol/L   Glucose, Bld 108 (H) 70 - 99 mg/dL    Comment: Glucose reference range applies only to samples taken after fasting for at least 8 hours.   BUN <5 (L) 6 - 20 mg/dL   Creatinine, Ser 0.63 0.44 - 1.00 mg/dL   Calcium 9.4 8.9 - 01.6 mg/dL   Total Protein 6.4 (L) 6.5 - 8.1 g/dL   Albumin 3.1 (L) 3.5 - 5.0 g/dL   AST 27 15 - 41 U/L   ALT 20 0 - 44 U/L   Alkaline Phosphatase 74 38 - 126 U/L   Total Bilirubin 0.5 0.3 - 1.2 mg/dL   GFR, Estimated >01 >09 mL/min    Comment: (NOTE) Calculated using the CKD-EPI Creatinine Equation (2021)    Anion gap 9 5 - 15    Comment: Performed at [facility], [address] [place], [place] [address]  Type and screen [facility]     Status: None   Collection Time: 02/24/20  7:07 PM  Result Value Ref Range   ABO/RH(D) A  POS    Antibody Screen NEG    Sample Expiration      02/27/2020,2359 Performed at [facility], [address] [place], [place] [address]    No results found.  Review of  Systems  Constitutional: Negative for chills and fever.  HENT: Negative for congestion, hearing loss and sinus pain.   Respiratory: Negative for cough, shortness of breath and wheezing.   Cardiovascular: Negative for chest pain, palpitations and leg swelling.  Gastrointestinal: Positive for diarrhea, nausea and vomiting. Negative for abdominal pain and constipation.  Genitourinary: Negative for dysuria, flank pain, frequency, hematuria and urgency.  Musculoskeletal: Negative for back pain.  Skin: Negative for rash.  Neurological: Positive for dizziness. Negative for headaches.  Psychiatric/Behavioral: Negative for suicidal ideas. The patient is not nervous/anxious.     Blood pressure 102/60, pulse 95, temperature 97.9 F (36.6 C), temperature source Oral, resp. rate 18, height 5\' 3"  (1.6 m), weight 68.9 kg, last menstrual period 08/26/2019. Physical Exam Vitals and nursing note reviewed.  Constitutional:      Appearance: She is well-developed and well-nourished.  HENT:     Head: Normocephalic and atraumatic.  Cardiovascular:     Rate and Rhythm: Normal rate and regular rhythm.  Pulmonary:     Effort: Pulmonary effort is normal.     Breath sounds: Normal breath sounds.  Abdominal:     General: Bowel sounds are normal.     Palpations: Abdomen is soft.     Comments: Gravid  Musculoskeletal:        General: Normal range of motion.  Skin:    General: Skin is warm and dry.  Neurological:     Mental Status: She is alert and oriented to person, place, and time.  Psychiatric:        Mood and Affect: Mood and affect normal.        Behavior: Behavior normal.        Thought Content: Thought content normal.    Assessment/Plan 20 yo G3P0020 [redacted]w[redacted]d 1. Hyperemesis in pregnancy, refractory to treatment  with new onset coffee ground emesis. Patient was offered admission with consultation to GI in the AM. She was unwilling to stay the night in the hospital. She became upset upon hearing that GI would likely not be in until the morning to see her. She requested discharge. I spoke with Dr. Bonna Gains. She was not able to see the patient this evening. She said she would reach out to [facility] regarding moving the patient's appointment up if possible. She also suggested starting omeprazole 20 mg BID.  Patient was receptive to this plan of care. She was discharged home in stable condition 2. 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  3. Encouraged marijuana cessation for assistant in ruling it out as a cause of cyclical vomiting.   Follow up in office as planned.   More than 60 minutes were spent face to face with the patient in the room, reviewing the medical record, labs and images, and coordinating care for the patient. The plan of management was discussed in detail and counseling was provided.    Homero Fellers, MD 02/24/2020, 8:51 PM

## 2020-02-24 NOTE — Discharge Instructions (Signed)
LABOR: When contractions begin, you should start to time them from the beginning of one contraction to the beginning of the next.  When contractions are 5-10 minutes apart or less and have been regular for at least an hour, you should call your health care provider.  Notify your doctor if any of the following occur: 1. Bleeding from the vagina 7. Sudden, constant, or occasional abdominal pain  2. Pain or burning when urinating 8. Sudden gushing of fluid from the vagina (with or without continued leaking)  3. Chills or fever 9. Fainting spells, "black outs" or loss of consciousness  4. Increase in vaginal discharge 10. Severe or continued nausea or vomiting  5. Pelvic pressure (sudden increase) 11. Blurring of vision or spots before the eyes  6. Baby moving less than usual 12. Leaking of fluid     

## 2020-02-24 NOTE — OB Triage Note (Signed)
Pt discharged home in stable condition. RN provided discharge instructions to pt, including reviewing prescription medications. RN reviewed information on when to come back if having pregnancy complications. Pt verbalized understanding and all questions answered at this time.

## 2020-02-24 NOTE — Discharge Summary (Signed)
Physician Discharge Summary  Patient ID: Adriana Kerr MRN: [id number] DOB/AGE: December 18, 2000 19 y.o.  Admit date: 02/24/2020 Discharge date: 02/24/2020  Admission Diagnoses:  Discharge Diagnoses:  Active Problems:   Hyperemesis affecting pregnancy, antepartum   Discharged Condition: fair  Hospital Course: please see H&P in epic  Consults: GI  Significant Diagnostic Studies: labs: see epic  Treatments: IV hydration  Discharge Exam: Blood pressure 102/60, pulse 95, temperature 97.9 F (36.6 C), temperature source Oral, resp. rate 18, height 5\' 3"  (1.6 m), weight 68.9 kg, last menstrual period 08/26/2019. General appearance: alert and cooperative Resp: clear to auscultation bilaterally Chest wall: no tenderness Cardio: regular rate and rhythm, S1, S2 normal, no murmur, click, rub or gallop GI: soft, non-tender; bowel sounds normal; no masses,  no organomegaly Extremities: extremities normal, atraumatic, no cyanosis or edema Pulses: 2+ and symmetric Skin: Skin color, texture, turgor normal. No rashes or lesions  Disposition: Discharge disposition: 01-Home or Self Care       Discharge Instructions    Discontinue IV   Complete by: As directed      Allergies as of 02/24/2020      Reactions   Flexeril [cyclobenzaprine]    Agitation and disorientation   Amoxicillin Hives   Tylenol With Codeine #3 [acetaminophen-codeine] Other (See Comments)   emesis   Augmentin [amoxicillin-pot Clavulanate] Rash      Medication List    STOP taking these medications   famotidine 20 MG tablet Commonly known as: PEPCID     TAKE these medications   brompheniramine-pseudoephedrine-DM 30-2-10 MG/5ML syrup Take 5 mLs by mouth 4 (four) times daily as needed.   hydrOXYzine 25 MG capsule Commonly known as: VISTARIL Take 1 capsule (25 mg total) by mouth 3 (three) times daily as needed.   metoCLOPramide 10 MG tablet Commonly known as: REGLAN Take 1 tablet (10 mg total) by mouth every  6 (six) hours as needed for nausea or vomiting.   omeprazole 20 MG capsule Commonly known as: PRILOSEC Take 1 capsule (20 mg total) by mouth 2 (two) times daily before a meal.   ondansetron 8 MG tablet Commonly known as: ZOFRAN Take 8 mg by mouth every 8 (eight) hours as needed for nausea or vomiting.   predniSONE 10 MG tablet Commonly known as: DELTASONE Take 1 tablet (10 mg total) by mouth daily. 6,5,4,3,2,1 six day taper   predniSONE 10 MG (21) Tbpk tablet Commonly known as: STERAPRED UNI-PAK 21 TAB Take by mouth daily. Take 6 tabs by mouth daily  for 2 days, then 5 tabs for 2 days, then 4 tabs for 2 days, then 3 tabs for 2 days, 2 tabs for 2 days, then 1 tab by mouth daily for 2 days        Signed: 02-16-1973 02/24/2020, 10:16 PM

## 2020-02-26 ENCOUNTER — Encounter: Payer: Self-pay | Admitting: Obstetrics and Gynecology

## 2020-02-27 ENCOUNTER — Telehealth: Payer: Self-pay

## 2020-02-27 NOTE — Telephone Encounter (Signed)
Patient experiencing hands and feet ithing. They have been so unbearable. She woke herself in the middle of the night scratching and itching so bad. She does not have a rash. She has tried ice without relief. UY#[phone]

## 2020-02-27 NOTE — Telephone Encounter (Signed)
Spoke w/patient. She has not changed soaps detergents or started any new medication. Advised can use Benadryl and OTC hydrocortisone cream. She will report any new symptoms or if this regimen doesn't help.

## 2020-02-28 ENCOUNTER — Other Ambulatory Visit: Payer: Self-pay

## 2020-02-28 ENCOUNTER — Encounter: Payer: Self-pay | Admitting: Obstetrics and Gynecology

## 2020-02-28 ENCOUNTER — Observation Stay
Admission: EM | Admit: 2020-02-28 | Discharge: 2020-02-28 | Disposition: A | Discharge status: Home / Self Care | Payer: Medicaid Other | Attending: Obstetrics & Gynecology | Admitting: Obstetrics & Gynecology

## 2020-02-28 ENCOUNTER — Other Ambulatory Visit: Payer: Self-pay | Admitting: Obstetrics & Gynecology

## 2020-02-28 DIAGNOSIS — Z3A25 25 weeks gestation of pregnancy: Secondary | ICD-10-CM | POA: Insufficient documentation

## 2020-02-28 DIAGNOSIS — O99712 Diseases of the skin and subcutaneous tissue complicating pregnancy, second trimester: Secondary | ICD-10-CM | POA: Diagnosis not present

## 2020-02-28 DIAGNOSIS — O210 Mild hyperemesis gravidarum: Secondary | ICD-10-CM

## 2020-02-28 DIAGNOSIS — O4402 Placenta previa specified as without hemorrhage, second trimester: Secondary | ICD-10-CM

## 2020-02-28 DIAGNOSIS — Z3482 Encounter for supervision of other normal pregnancy, second trimester: Secondary | ICD-10-CM

## 2020-02-28 DIAGNOSIS — O26892 Other specified pregnancy related conditions, second trimester: Secondary | ICD-10-CM | POA: Diagnosis present

## 2020-02-28 DIAGNOSIS — L299 Pruritus, unspecified: Secondary | ICD-10-CM | POA: Diagnosis not present

## 2020-02-28 DIAGNOSIS — O368121 Decreased fetal movements, second trimester, fetus 1: Secondary | ICD-10-CM | POA: Diagnosis not present

## 2020-02-28 HISTORY — DX: Depression, unspecified: F32.A

## 2020-02-28 LAB — BASIC METABOLIC PANEL
Anion gap: 9 (ref 5–15)
BUN: 7 mg/dL (ref 6–20)
CO2: 23 mmol/L (ref 22–32)
Calcium: 8.7 mg/dL — ABNORMAL LOW (ref 8.9–10.3)
Chloride: 104 mmol/L (ref 98–111)
Creatinine, Ser: 0.51 mg/dL (ref 0.44–1.00)
GFR, Estimated: >60 mL/min (ref >60)
Glucose, Bld: 78 mg/dL (ref 70–99)
Potassium: 3.5 mmol/L (ref 3.5–5.1)
Sodium: 136 mmol/L (ref 135–145)

## 2020-02-28 LAB — CBC
HCT: 36.1 % (ref 36.0–46.0)
Hemoglobin: 12 g/dL (ref 12.0–15.0)
MCH: 29.9 pg (ref 26.0–34.0)
MCHC: 33.2 g/dL (ref 30.0–36.0)
MCV: 89.8 fL (ref 80.0–100.0)
Platelets: 162 10*3/uL (ref 150–400)
RBC: 4.02 MIL/uL (ref 3.87–5.11)
RDW: 12.4 % (ref 11.5–15.5)
WBC: 5.5 10*3/uL (ref 4.0–10.5)
nRBC: 0 % (ref 0.0–0.2)

## 2020-02-28 MED ORDER — PREDNISONE 10 MG (21) PO TBPK: ORAL_TABLET | ORAL | 0 refills | Status: DC

## 2020-02-28 MED ORDER — ONDANSETRON HCL 4 MG/2ML IJ SOLN: 4.0000 mg | Freq: Four times a day (QID) | INTRAMUSCULAR | Status: DC | PRN

## 2020-02-28 MED ORDER — PREDNISONE 10 MG PO TABS: 10.0000 mg | ORAL_TABLET | Freq: Every day | ORAL | Status: DC

## 2020-02-28 NOTE — Discharge Instructions (Signed)
Prednisone tablets What is this medicine? PREDNISONE (PRED ni sone) is a corticosteroid. It is commonly used to treat inflammation of the skin, joints, lungs, and other organs. Common conditions treated include asthma, allergies, and arthritis. It is also used for other conditions, such as blood disorders and diseases of the adrenal glands. This medicine may be used for other purposes; ask your health care provider or pharmacist if you have questions. COMMON BRAND NAME(S): Deltasone, Predone, Sterapred, Sterapred DS What should I tell my health care provider before I take this medicine? They need to know if you have any of these conditions:  Cushing's syndrome  diabetes  glaucoma  heart disease  high blood pressure  infection (especially a virus infection such as chickenpox, cold sores, or herpes)  kidney disease  liver disease  mental illness  myasthenia gravis  osteoporosis  seizures  stomach or intestine problems  thyroid disease  an unusual or allergic reaction to lactose, prednisone, other medicines, foods, dyes, or preservatives  pregnant or trying to get pregnant  breast-feeding How should I use this medicine? Take this medicine by mouth with a glass of water. Follow the directions on the prescription label. Take this medicine with food. If you are taking this medicine once a day, take it in the morning. Do not take more medicine than you are told to take. Do not suddenly stop taking your medicine because you may develop a severe reaction. Your doctor will tell you how much medicine to take. If your doctor wants you to stop the medicine, the dose may be slowly lowered over time to avoid any side effects. Talk to your pediatrician regarding the use of this medicine in children. Special care may be needed. Overdosage: If you think you have taken too much of this medicine contact a poison control center or emergency room at once. NOTE: This medicine is only for you.  Do not share this medicine with others. What if I miss a dose? If you miss a dose, take it as soon as you can. If it is almost time for your next dose, talk to your doctor or health care professional. You may need to miss a dose or take an extra dose. Do not take double or extra doses without advice. What may interact with this medicine? Do not take this medicine with any of the following medications:  metyrapone  mifepristone This medicine may also interact with the following medications:  aminoglutethimide  amphotericin B  aspirin and aspirin-like medicines  barbiturates  certain medicines for diabetes, like glipizide or glyburide  cholestyramine  cholinesterase inhibitors  cyclosporine  digoxin  diuretics  ephedrine  female hormones, like estrogens and birth control pills  isoniazid  ketoconazole  NSAIDS, medicines for pain and inflammation, like ibuprofen or naproxen  phenytoin  rifampin  toxoids  vaccines  warfarin This list may not describe all possible interactions. Give your health care provider a list of all the medicines, herbs, non-prescription drugs, or dietary supplements you use. Also tell them if you smoke, drink alcohol, or use illegal drugs. Some items may interact with your medicine. What should I watch for while using this medicine? Visit your doctor or health care professional for regular checks on your progress. If you are taking this medicine over a prolonged period, carry an identification card with your name and address, the type and dose of your medicine, and your doctor's name and address. This medicine may increase your risk of getting an infection. Tell your doctor or   health care professional if you are around anyone with measles or chickenpox, or if you develop sores or blisters that do not heal properly. If you are going to have surgery, tell your doctor or health care professional that you have taken this medicine within the last  twelve months. Ask your doctor or health care professional about your diet. You may need to lower the amount of salt you eat. This medicine may increase blood sugar. Ask your healthcare provider if changes in diet or medicines are needed if you have diabetes. What side effects may I notice from receiving this medicine? Side effects that you should report to your doctor or health care professional as soon as possible:  allergic reactions like skin rash, itching or hives, swelling of the face, lips, or tongue  changes in emotions or moods  changes in vision  depressed mood  eye pain  fever or chills, cough, sore throat, pain or difficulty passing urine  signs and symptoms of high blood sugar such as being more thirsty or hungry or having to urinate more than normal. You may also feel very tired or have blurry vision.  swelling of ankles, feet Side effects that usually do not require medical attention (report to your doctor or health care professional if they continue or are bothersome):  confusion, excitement, restlessness  headache  nausea, vomiting  skin problems, acne, thin and shiny skin  trouble sleeping  weight gain This list may not describe all possible side effects. Call your doctor for medical advice about side effects. You may report side effects to FDA at 1-800-FDA-1088. Where should I keep my medicine? Keep out of the reach of children. Store at room temperature between 15 and 30 degrees C (59 and 86 degrees F). Protect from light. Keep container tightly closed. Throw away any unused medicine after the expiration date. NOTE: This sheet is a summary. It may not cover all possible information. If you have questions about this medicine, talk to your doctor, pharmacist, or health care provider.  2020 Elsevier/Gold Standard (2017-11-06 10:54:22)  

## 2020-02-28 NOTE — Final Progress Note (Signed)
Physician Final Progress Note  Patient ID: Adriana Kerr MRN: [id number] DOB/AGE: 04-06-2000 20 y.o.  Admit date: 02/28/2020 Admitting provider: Nadara Mustard, MD Discharge date: 02/28/2020  Admission Diagnoses: Itching  Discharge Diagnoses:  Principal Problem:   Itching   25 weeks pregnancy  Consults: None  Significant Findings/ Diagnostic Studies: Pt seen and examined due to c/o itching that is mostly of the soles and palms, no rash or lesions.   No recent exposures of concern.  Has had nausea throughout pregnancy and has lost weight.  FM up and down.  Pt reports no recent steroid use, was given steroid Rx for nausea Rx plan, but did not take it.  Also prior steroid use years ago.    No PMH, PSH, SH, or FH od concern or related today ROS  Neg except as above.  Current Outpatient Medications  Medication Instructions  . diphenhydrAMINE (BENADRYL) 25 mg, Oral,  Once, Patient took once for itching without any improvement  . metoCLOPramide (REGLAN) 10 mg, Oral, Every 6 hours PRN  . omeprazole (PRILOSEC) 20 mg, Oral, 2 times daily before meals  . ondansetron (ZOFRAN) 8 mg, Oral, Every 8 hours PRN  . predniSONE (DELTASONE) 10 mg, Oral, Daily, 6,5,4,3,2,1 six day taper  . predniSONE (STERAPRED UNI-PAK 21 TAB) 10 MG (21) TBPK tablet Oral, Daily, Take 6 tabs by mouth daily  for 2 days, then 5 tabs for 2 days, then 4 tabs for 2 days, then 3 tabs for 2 days, 2 tabs for 2 days, then 1 tab by mouth daily for 2 days  . predniSONE (STERAPRED UNI-PAK 21 TAB) 10 MG (21) TBPK tablet Take six pills day one, then 5 pills day 2, then 4, then 3, then 2, then 1   Allergies  Allergen Reactions  . Flexeril [Cyclobenzaprine]     Agitation and disorientation  . Amoxicillin Hives  . Tylenol With Codeine #3 [Acetaminophen-Codeine] Other (See Comments)    emesis  . Augmentin [Amoxicillin-Pot Clavulanate] Rash   Physical Exam Constitutional:      General: She is not in acute distress.    Appearance:  She is well-developed and well-nourished.  Abdominal:     Palpations: Abdomen is soft.     Tenderness: There is no abdominal tenderness.     Comments: Gravid FHT 140s  Musculoskeletal:        General: Normal range of motion.  Neurological:     Mental Status: She is alert and oriented to person, place, and time.  Skin:    General: Skin is warm and dry.  Psychiatric:        Mood and Affect: Mood and affect normal.  Vitals reviewed.   A/P: Labs to evaluate bile acids as rare etiology for itching in pregnancy Steroid taper Cholesterimine if bile acids found (will call w results) Benedryl prn as she has been doing' Zofran for nausea Fluids, diet as able   Procedures: Labs, FHR reassuring.  Discharge Condition: good  Disposition: Discharge disposition: 01-Home or Self Care       Diet: Regular diet  Discharge Activity: Activity as tolerated  Discharge Instructions    Call MD for:   Complete by: As directed    Worsening contractions or pain; leakage of fluid; bleeding.   Diet - low sodium heart healthy   Complete by: As directed    Increase activity slowly   Complete by: As directed      Allergies as of 02/28/2020      Reactions  Flexeril [cyclobenzaprine]    Agitation and disorientation   Amoxicillin Hives   Tylenol With Codeine #3 [acetaminophen-codeine] Other (See Comments)   emesis   Augmentin [amoxicillin-pot Clavulanate] Rash      Medication List    STOP taking these medications   omeprazole 20 MG capsule Commonly known as: PRILOSEC     TAKE these medications   diphenhydrAMINE 25 mg capsule Commonly known as: BENADRYL Take 25 mg by mouth once. Patient took once for itching without any improvement   metoCLOPramide 10 MG tablet Commonly known as: REGLAN Take 1 tablet (10 mg total) by mouth every 6 (six) hours as needed for nausea or vomiting.   ondansetron 8 MG tablet Commonly known as: ZOFRAN Take 8 mg by mouth every 8 (eight) hours as needed  for nausea or vomiting.   predniSONE 10 MG (21) Tbpk tablet Commonly known as: STERAPRED UNI-PAK 21 TAB Take six pills day one, then 5 pills day 2, then 4, then 3, then 2, then 1 What changed:   how to take this  when to take this  additional instructions  Another medication with the same name was removed. Continue taking this medication, and follow the directions you see here.       Follow-up Information    [facility], [place]. Go in 2 week(s).   Why: As scheduled Contact information: [address] [place] [address] [phone]               Total time spent taking care of this patient: 20 minutes  Signed: Letitia Libra 02/28/2020, 3:03 PM

## 2020-02-28 NOTE — OB Triage Note (Signed)
Patient to OBS 3 with complaints of decreased fetal movement.  She also reports itching that is mostly hands and feet but sometimes other areas that has worsened since Wednesday when the itching began.  She tried benadryl on Friday but no improvement in itching. She reports keeping mostly water and gatorade down but not food.  She reports her last episode of vomiting at 10:30 this morning after trying a bite of food.  She denies LOF, bleeding, and cramping.

## 2020-02-28 NOTE — Discharge Summary (Signed)
  See FPN 

## 2020-03-01 ENCOUNTER — Telehealth: Payer: Self-pay

## 2020-03-01 ENCOUNTER — Telehealth: Payer: Self-pay | Admitting: Obstetrics and Gynecology

## 2020-03-01 ENCOUNTER — Telehealth: Payer: Self-pay | Admitting: Obstetrics & Gynecology

## 2020-03-01 DIAGNOSIS — K831 Obstruction of bile duct: Secondary | ICD-10-CM

## 2020-03-01 DIAGNOSIS — O26612 Liver and biliary tract disorders in pregnancy, second trimester: Secondary | ICD-10-CM | POA: Insufficient documentation

## 2020-03-01 DIAGNOSIS — O210 Mild hyperemesis gravidarum: Secondary | ICD-10-CM

## 2020-03-01 LAB — BILE ACIDS, TOTAL: Bile Acids Total: 28.9 umol/L — ABNORMAL HIGH (ref 0.0–10.0)

## 2020-03-01 MED ORDER — PROMETHAZINE HCL 25 MG PO TABS: 25.0000 mg | ORAL_TABLET | Freq: Three times a day (TID) | ORAL | 0 refills | Status: DC | PRN

## 2020-03-01 MED ORDER — PROMETHAZINE HCL 25 MG RE SUPP: 25.0000 mg | Freq: Four times a day (QID) | RECTAL | 1 refills | Status: DC | PRN

## 2020-03-01 MED ORDER — HYDROXYZINE HCL 25 MG PO TABS: 25.0000 mg | ORAL_TABLET | Freq: Every evening | ORAL | 1 refills | Status: DC | PRN

## 2020-03-01 MED ORDER — URSODIOL 500 MG PO TABS: 500.0000 mg | ORAL_TABLET | Freq: Two times a day (BID) | ORAL | 3 refills | Status: DC

## 2020-03-01 NOTE — Telephone Encounter (Signed)
LMVM to notify patient. 

## 2020-03-01 NOTE — Telephone Encounter (Signed)
Pt called requesting test results. 

## 2020-03-01 NOTE — Telephone Encounter (Signed)
Patient inquiring about Bile Acid lab results again. Itching is unbearable. RA#[phone]

## 2020-03-01 NOTE — Telephone Encounter (Signed)
Let her know labs not back yet.

## 2020-03-01 NOTE — Telephone Encounter (Signed)
Called pt to f/u on after hours triage/nurse call, no answer, LVMTRC.

## 2020-03-01 NOTE — Telephone Encounter (Signed)
Called patient to review lab results from [facility] triage visit on 02/28/20. Patient identified with two identifiers. Discussed elevated bile acid results in context of itching. Reviewed diagnosis of intrahepatic cholestasis of pregnancy including fetal surveillance in the third trimester and that recommendation for timing of delivery may be altered. Will start UDCA. Patient stated understanding. Patient with concern regarding ability to tolerate PO medication. Discussed with patient risks vs benefits of taking UDCA and the side effect profile of the antiemetics she has tried during this pregnancy. Patient stated she would be willing to try phenergan Rx to see if she could tolerate PO administration of UDCA. Will have office call patient to schedule f/u with MD next week.

## 2020-03-01 NOTE — Telephone Encounter (Signed)
Spoke to pt, went to ER to had labs drawn, aware all labs are not back.

## 2020-03-02 ENCOUNTER — Other Ambulatory Visit: Payer: Self-pay | Admitting: Obstetrics & Gynecology

## 2020-03-02 DIAGNOSIS — K831 Obstruction of bile duct: Secondary | ICD-10-CM

## 2020-03-05 NOTE — Telephone Encounter (Signed)
Patient transferred from front desk. She has questions about rx's she picked up from pharmacy. She has prednisone rx'd by [facility] and ursodiol, phenergan, omeprazole. Patient has not started any of these meds. Uncertain whether to take all at same time.  Clarified with RPH and advised patient to hold off on Prednisone now that we know it is a bile acid issue. Start ursodiol and give it a few weeks to see if it is going to work. Ok to take phenergan as needed for nausea and omeprazole for acid reflux.

## 2020-03-08 ENCOUNTER — Encounter: Payer: Medicaid Other | Admitting: Obstetrics and Gynecology

## 2020-03-10 ENCOUNTER — Ambulatory Visit (INDEPENDENT_AMBULATORY_CARE_PROVIDER_SITE_OTHER): Payer: Medicaid Other | Admitting: Obstetrics & Gynecology

## 2020-03-10 ENCOUNTER — Other Ambulatory Visit: Payer: Self-pay

## 2020-03-10 ENCOUNTER — Other Ambulatory Visit (HOSPITAL_COMMUNITY)
Admission: RE | Admit: 2020-03-10 | Discharge: 2020-03-10 | Disposition: A | Discharge status: Home / Self Care | Payer: Medicaid Other | Source: Ambulatory Visit | Attending: Obstetrics & Gynecology | Admitting: Obstetrics & Gynecology

## 2020-03-10 ENCOUNTER — Encounter: Payer: Self-pay | Admitting: Obstetrics & Gynecology

## 2020-03-10 VITALS — BP 100/70 | Wt 155.0 lb

## 2020-03-10 DIAGNOSIS — Z369 Encounter for antenatal screening, unspecified: Secondary | ICD-10-CM

## 2020-03-10 DIAGNOSIS — K831 Obstruction of bile duct: Secondary | ICD-10-CM

## 2020-03-10 DIAGNOSIS — O26612 Liver and biliary tract disorders in pregnancy, second trimester: Secondary | ICD-10-CM

## 2020-03-10 DIAGNOSIS — Z3482 Encounter for supervision of other normal pregnancy, second trimester: Secondary | ICD-10-CM

## 2020-03-10 DIAGNOSIS — Z3A27 27 weeks gestation of pregnancy: Secondary | ICD-10-CM

## 2020-03-10 MED ORDER — ACCU-CHEK SOFTCLIX LANCETS MISC: 1.0000 | Freq: Four times a day (QID) | 12 refills | Status: DC

## 2020-03-10 MED ORDER — GLUCOSE BLOOD VI STRP: ORAL_STRIP | 12 refills | Status: DC

## 2020-03-10 MED ORDER — ACCU-CHEK NANO SMARTVIEW W/DEVICE KIT: 1.0000 | PACK | 0 refills | Status: DC

## 2020-03-10 NOTE — Patient Instructions (Signed)

## 2020-03-10 NOTE — Progress Notes (Signed)
  Subjective  Fetal Movement? yes Contractions? no Leaking Fluid? no Vaginal Bleeding? no Improved sx's from ICP since on Actigall Objective  BP 100/70   Wt 155 lb (70.3 kg)   LMP 08/26/2019   BMI 27.46 kg/m  General: NAD Pumonary: no increased work of breathing Abdomen: gravid, non-tender Extremities: no edema Psychiatric: mood appropriate, affect full  Assessment  20 y.o. 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   Relevant Orders   Hepatic function panel     Other   Encounter for supervision of other normal pregnancy, second trimester    Other Visit Diagnoses    [redacted] weeks gestation of pregnancy    -  Primary   Prenatal screening encounter       Relevant Orders   GC/Chlamydia probe amp ([place])not at [facility]   RPR+Rh+ABO+Rub Ab+Ab Scr+CB.Marland KitchenMarland Kitchen   Urine Culture      Pregnancy1 Problems (from 01/29/20 to present)    Problem Noted Resolved   Hyperemesis affecting pregnancy, antepartum 02/24/2020 by Natale Milch, MD No   Placenta previa antepartum in second trimester 02/24/2020 by Zipporah Plants, CNM No   Encounter for supervision of other normal pregnancy, second trimester 01/29/2020 by Christeen Douglas, MD No   Overview Signed 02/24/2020  9:30 PM by Zipporah Plants, CNM    Clinic [facility] (NOB labs at [facility] on 11/13/19)  Dating  7w [place] Blood type: --/--/A POS (01/04 1907)   Genetic Screen  NIPS: negx3, xx Antibody:NEG (01/04 1907)  Anatomic US  anatomy wnl - marginal placental previa - needs repeat at 28 weeks Rubella:   immune  Varicella: immune  GTT  Third trimester: desires glucose monitoring as alternative option RPR:   non-reactive  Rhogam  n/a HBsAg:   non-reactive  TDaP vaccine                       Flu Shot: declines HIV:   negative  Baby Food  breast              GBS:   Contraception   BTL if cesarean Pap: n/a (<21 yo)  CBB     CS/VBAC    Support Person  Ryan             Desires glucometer and home checks for one week vs glucola due to nausea and vomiting and difficulty with tolerating drink  Annamarie Major, MD, [facility], [facility] 03/10/2020  3:11 PM

## 2020-03-11 LAB — RPR+RH+ABO+RUB AB+AB SCR+CB...
Antibody Screen: NEGATIVE
HIV Screen 4th Generation wRfx: NONREACTIVE
Hematocrit: 33.5 % — ABNORMAL LOW (ref 34.0–46.6)
Hemoglobin: 11.4 g/dL (ref 11.1–15.9)
Hepatitis B Surface Ag: NEGATIVE
MCH: 30 pg (ref 26.6–33.0)
MCHC: 34 g/dL (ref 31.5–35.7)
MCV: 88 fL (ref 79–97)
Platelets: 200 10*3/uL (ref 150–450)
RBC: 3.8 x10E6/uL (ref 3.77–5.28)
RDW: 12.1 % (ref 11.7–15.4)
RPR Ser Ql: NONREACTIVE
Rh Factor: POSITIVE
Rubella Antibodies, IGG: 5.5 index (ref >0.99)
Varicella zoster IgG: 542 index (ref >165)
WBC: 6.9 10*3/uL (ref 3.4–10.8)

## 2020-03-12 LAB — GC/CHLAMYDIA PROBE AMP (~~LOC~~) NOT AT ARMC
Chlamydia: NEGATIVE
Comment: NEGATIVE
Comment: NORMAL
Neisseria Gonorrhea: NEGATIVE

## 2020-03-12 LAB — URINE CULTURE

## 2020-03-16 ENCOUNTER — Ambulatory Visit (INDEPENDENT_AMBULATORY_CARE_PROVIDER_SITE_OTHER): Payer: Medicaid Other | Admitting: Obstetrics and Gynecology

## 2020-03-16 ENCOUNTER — Other Ambulatory Visit: Payer: Self-pay

## 2020-03-16 ENCOUNTER — Inpatient Hospital Stay: Payer: Medicaid Other

## 2020-03-16 VITALS — BP 110/68 | Wt 153.0 lb

## 2020-03-16 DIAGNOSIS — K831 Obstruction of bile duct: Secondary | ICD-10-CM

## 2020-03-16 DIAGNOSIS — O0993 Supervision of high risk pregnancy, unspecified, third trimester: Secondary | ICD-10-CM

## 2020-03-16 DIAGNOSIS — O26613 Liver and biliary tract disorders in pregnancy, third trimester: Secondary | ICD-10-CM

## 2020-03-16 DIAGNOSIS — Z3A28 28 weeks gestation of pregnancy: Secondary | ICD-10-CM

## 2020-03-16 DIAGNOSIS — O4402 Placenta previa specified as without hemorrhage, second trimester: Secondary | ICD-10-CM

## 2020-03-16 NOTE — Progress Notes (Addendum)
Routine Prenatal Care Visit  Subjective  Adriana Kerr is a 20 y.o. G3P0020 at [redacted]w[redacted]d being seen today for ongoing prenatal care.  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  ----------------------------------------------------------------------------------- Patient reports improvement in N/V - able to tolerate more oral intake. Decrease in itching sx, no longer present. .   Contractions: Not present. Vag. Bleeding: None.  Movement: Present. Denies leaking of fluid.  ----------------------------------------------------------------------------------- The following portions of the patient's history were reviewed and updated as appropriate: allergies, current medications, past family history, past medical history, past social history, past surgical history and problem list. Problem list updated.   Objective  Blood pressure 110/68, weight 153 lb (69.4 kg), last menstrual period 08/26/2019. Pregravid weight 171 lb (77.6 kg) Total Weight Gain -18 lb (-8.165 kg) Urinalysis:      Fetal Status: Fetal Heart Rate (bpm): 145 Fundal Height: 28 cm Movement: Present      [place]:  Singleton intrauterine pregnancy is visualized with FHR at 143 BPM.  Fetal presentation is Breech.  Placenta: anterior with no previa. Grade: 2. Cervix: 2.8cm AFI: subjectively normal. Anatomic survey was done at [facility] clinic There is no free peritoneal fluid in the cul de sac.  General:  Alert, oriented  and cooperative. Patient is in no acute distress.  Skin: Skin is warm and dry. No rash noted.   Cardiovascular: Normal heart rate noted  Respiratory: Normal respiratory effort, no problems with respiration noted  Abdomen: Soft, gravid, appropriate for gestational age. Pain/Pressure: Absent     Pelvic:  Cervical exam deferred        Extremities: Normal range of motion.  Edema: None  ental Status: Normal mood and affect. Normal behavior. Normal judgment and thought content.     Assessment   19 y.o. G3P0020 at [redacted]w[redacted]d by  06/06/2020, by Ultrasound presenting for routine prenatal visit  Plan   Pregnancy1 Problems (from 01/29/20 to present)    Problem Noted Resolved   Hyperemesis affecting pregnancy, antepartum 02/24/2020 by Natale Milch, MD No   Placenta previa antepartum in second trimester 02/24/2020 by Zipporah Plants, CNM No   Encounter for supervision of high risk pregnancy in third trimester, antepartum 01/29/2020 by Christeen Douglas, MD No   Overview Addendum 03/16/2020  1:59 PM by Zipporah Plants, CNM    Clinic [facility] (NOB labs at [facility] on 11/13/19)  Dating  7w [place] Blood type: --/--/A POS (01/04 1907)   Genetic Screen  NIPS: negx3, xx Antibody:NEG (01/04 1907)  Anatomic US  anatomy wnl - marginal placental previa - needs repeat at 28 weeks Rubella:   immune  Varicella: immune  GTT  Third trimester: desires glucose monitoring as alternative option RPR:   non-reactive  Rhogam  n/a HBsAg:   non-reactive  TDaP vaccine                       Flu Shot: declines HIV:   negative  Baby Food  breast              GBS:   Contraception  BTL if cesarean  Pap: n/a (<21 yo)  CBB     CS/VBAC    Support Person  Ryan     Pregnancy Diagnoses  ICP (bile acids 28 on 02/28/20)  Per Dr. Grace Bushy (MFM) - weekly NST/AFI until 32 weeks - then weekly BPP/NST        Previous Version      -Reviewed US findings with patient - placenta previa resolved -Discussed case via phone with Dr. Grace Bushy  (MFM) - rec to start weekly NST/AFI, then weekly BPP at 32 weeks -Reviewed process for checking blood glucose levels at home - patient to pick-up previously Rx'd meter today. Will review BGs next visit.  Preterm labor precautions including but not limited to vaginal bleeding, contractions, leaking of fluid and fetal movement were reviewed in detail with the patient.    Return in about 1 week (around 03/23/2020) for ROB with NST/AFI.  Zipporah Plants, CNM, MSN [facility], [facility] 03/16/2020, 2:00 PM

## 2020-03-19 ENCOUNTER — Telehealth: Payer: Self-pay

## 2020-03-19 NOTE — Telephone Encounter (Signed)
Patient has been tracking her blood sugar for the last 3 days. She noticed after eating her reading is under 100. Inquiring if normal? She thought she was told it should be more like 120 1 hour after eating. [phone]

## 2020-03-19 NOTE — Telephone Encounter (Signed)
Spoke w/patient to inquire specific blood sugars. She reports 85 this am, 83 1 hr after meal, 95 2pm prior to meal. Advised on signs of low blood sugar sweating, shakiness, hunger, blurred vision, and dizziness. don't drive if your blood sugar level is less than 70 mg/dL. Keep some hard candy, raisins, or other sugary foods with you at all times. Eat some at the first sign of low blood sugar. Advised activity can lower blood sugar (more activity than food consumed) eat more. She has been sick this pregnancy and unable to keep much food down (likely the cause of the lower reading 1 hour after meals). She will monitor and try to eat more/frequent small snacks/meals.

## 2020-03-20 ENCOUNTER — Observation Stay
Admission: EM | Admit: 2020-03-20 | Discharge: 2020-03-20 | Disposition: A | Discharge status: Home / Self Care | Payer: Medicaid Other | Attending: Obstetrics and Gynecology | Admitting: Obstetrics and Gynecology

## 2020-03-20 ENCOUNTER — Encounter: Payer: Self-pay | Admitting: Obstetrics and Gynecology

## 2020-03-20 ENCOUNTER — Other Ambulatory Visit: Payer: Self-pay

## 2020-03-20 DIAGNOSIS — R109 Unspecified abdominal pain: Secondary | ICD-10-CM

## 2020-03-20 DIAGNOSIS — O99891 Other specified diseases and conditions complicating pregnancy: Secondary | ICD-10-CM | POA: Diagnosis not present

## 2020-03-20 DIAGNOSIS — R1030 Lower abdominal pain, unspecified: Secondary | ICD-10-CM | POA: Insufficient documentation

## 2020-03-20 DIAGNOSIS — Z79899 Other long term (current) drug therapy: Secondary | ICD-10-CM | POA: Diagnosis not present

## 2020-03-20 DIAGNOSIS — Z3A28 28 weeks gestation of pregnancy: Secondary | ICD-10-CM

## 2020-03-20 DIAGNOSIS — O26893 Other specified pregnancy related conditions, third trimester: Secondary | ICD-10-CM | POA: Diagnosis present

## 2020-03-20 DIAGNOSIS — R8271 Bacteriuria: Secondary | ICD-10-CM | POA: Diagnosis not present

## 2020-03-20 DIAGNOSIS — Z7722 Contact with and (suspected) exposure to environmental tobacco smoke (acute) (chronic): Secondary | ICD-10-CM | POA: Insufficient documentation

## 2020-03-20 DIAGNOSIS — O0993 Supervision of high risk pregnancy, unspecified, third trimester: Secondary | ICD-10-CM

## 2020-03-20 DIAGNOSIS — O2393 Unspecified genitourinary tract infection in pregnancy, third trimester: Secondary | ICD-10-CM | POA: Diagnosis not present

## 2020-03-20 DIAGNOSIS — O210 Mild hyperemesis gravidarum: Secondary | ICD-10-CM

## 2020-03-20 DIAGNOSIS — O4402 Placenta previa specified as without hemorrhage, second trimester: Secondary | ICD-10-CM

## 2020-03-20 LAB — URINALYSIS, COMPLETE (UACMP) WITH MICROSCOPIC
Bilirubin Urine: NEGATIVE
Glucose, UA: >=500 mg/dL — AB
Hgb urine dipstick: NEGATIVE
Ketones, ur: NEGATIVE mg/dL
Nitrite: NEGATIVE
Protein, ur: NEGATIVE mg/dL
Specific Gravity, Urine: 1.011 (ref 1.005–1.030)
pH: 7 (ref 5.0–8.0)

## 2020-03-20 NOTE — OB Triage Note (Signed)
Discharge instructions and red flag symptoms reviewed. Pt verbalized understanding. Pt stable at time of discharge with significant other.

## 2020-03-20 NOTE — Discharge Summary (Signed)
Physician Final Progress Note  Patient ID: JLEE HARKLESS MRN: [id number] DOB/AGE: 2000-03-14 20 y.o.  Admit date: 03/20/2020 Admitting provider: Rod Can, CNM Discharge date: 03/20/2020   Admission Diagnoses: lower abdominal discomfort  Discharge Diagnoses:  Active Problems:   Abdominal pain during pregnancy in third trimester   [redacted] weeks gestation of pregnancy   Bacteriuria during pregnancy in third trimester    History of Present Illness: The patient is a 20 y.o. female G53P0020 at 36w6dwho presents for discomfort- dull and achy in her lower abdomen. She denies burning with urination. She admits frequency and is able to empty her bladder. She does continue to have daily N/V since first trimester. Her urine is darker than usual. She reports fetal movement. She denies contractions, leakage of fluid or vaginal bleeding.   She was admitted for observation, placed on monitors and urine sent for Urinalysis. Toco is negative and fetal heart rate tracing is reassuring. UA is suspicious for UTI and culture is ordered.   Her symptoms are tolerable. She prefers to wait for results of Urine Culture prior to taking antibiotics. Patient is discharged to home with instructions and precautions. She is encouraged to increase hydration.   Past Medical History:  Diagnosis Date  . Anxiety   . Depression   . Migraines   . PONV (postoperative nausea and vomiting)     Past Surgical History:  Procedure Laterality Date  . ADENOIDECTOMY    . ELBOW SURGERY    . KNEE SURGERY    . TONSILLECTOMY      No current facility-administered medications on file prior to encounter.   Current Outpatient Medications on File Prior to Encounter  Medication Sig Dispense Refill  . Accu-Chek Softclix Lancets lancets 1 each by Other route 4 (four) times daily. Use as instructed 100 each 12  . Blood Glucose Monitoring Suppl (ACCU-CHEK NANO SMARTVIEW) w/Device KIT 1 kit by Subdermal route as directed. Check  blood sugars for fasting, and two hours after breakfast, lunch and dinner (4 checks daily) 1 kit 0  . glucose blood test strip Use as instructed 100 each 12  . ondansetron (ZOFRAN) 8 MG tablet Take 8 mg by mouth every 8 (eight) hours as needed for nausea or vomiting.    . ursodiol (ACTIGALL) 500 MG tablet Take 1 tablet (500 mg total) by mouth 2 (two) times daily. 60 tablet 3  . diphenhydrAMINE (BENADRYL) 25 mg capsule Take 25 mg by mouth once. Patient took once for itching without any improvement (Patient not taking: No sig reported)    . hydrOXYzine (ATARAX/VISTARIL) 25 MG tablet Take 1 tablet (25 mg total) by mouth at bedtime as needed for itching. (Patient not taking: No sig reported) 30 tablet 1  . metoCLOPramide (REGLAN) 10 MG tablet Take 1 tablet (10 mg total) by mouth every 6 (six) hours as needed for nausea or vomiting. 30 tablet 1  . predniSONE (STERAPRED UNI-PAK 21 TAB) 10 MG (21) TBPK tablet Take six pills day one, then 5 pills day 2, then 4, then 3, then 2, then 1 (Patient not taking: No sig reported) 21 tablet 0  . promethazine (PHENERGAN) 25 MG suppository Place 1 suppository (25 mg total) rectally every 6 (six) hours as needed for up to 7 days for nausea. 12 suppository 1  . promethazine (PHENERGAN) 25 MG tablet Take 1 tablet (25 mg total) by mouth every 8 (eight) hours as needed for nausea or vomiting. (Patient not taking: No sig reported) 30 tablet 0  Allergies  Allergen Reactions  . Flexeril [Cyclobenzaprine]     Agitation and disorientation  . Amoxicillin Hives  . Tylenol With Codeine #3 [Acetaminophen-Codeine] Other (See Comments)    emesis  . Augmentin [Amoxicillin-Pot Clavulanate] Rash    Social History   Socioeconomic History  . Marital status: Married    Spouse name: Thurmond Butts   . Number of children: Not on file  . Years of education: Not on file  . Highest education level: Not on file  Occupational History  . Not on file  Tobacco Use  . Smoking status: Passive  Smoke Exposure - Never Smoker  . Smokeless tobacco: Never Used  Vaping Use  . Vaping Use: Never used  Substance and Sexual Activity  . Alcohol use: No  . Drug use: Yes    Types: Marijuana    Comment: Last use 03/19/20  . Sexual activity: Yes    Birth control/protection: None  Other Topics Concern  . Not on file  Social History Narrative  . Not on file   Social Determinants of Health   Financial Resource Strain: Not on file  Food Insecurity: Not on file  Transportation Needs: Not on file  Physical Activity: Not on file  Stress: Not on file  Social Connections: Not on file  Intimate Partner Violence: Not on file    Family History  Problem Relation Age of Onset  . Ovarian cancer Maternal Grandmother   . Breast cancer Paternal Grandmother 93     Review of Systems  Constitutional: Negative for chills and fever.  HENT: Negative for congestion, ear discharge, ear pain, hearing loss, sinus pain and sore throat.   Eyes: Negative for blurred vision and double vision.  Respiratory: Negative for cough, shortness of breath and wheezing.   Cardiovascular: Negative for chest pain, palpitations and leg swelling.  Gastrointestinal: Positive for abdominal pain. Negative for blood in stool, constipation, diarrhea, heartburn, melena, nausea and vomiting.  Genitourinary: Positive for frequency. Negative for dysuria, flank pain, hematuria and urgency.  Musculoskeletal: Negative for back pain, joint pain and myalgias.  Skin: Negative for itching and rash.  Neurological: Negative for dizziness, tingling, tremors, sensory change, speech change, focal weakness, seizures, loss of consciousness, weakness and headaches.  Endo/Heme/Allergies: Negative for environmental allergies. Does not bruise/bleed easily.  Psychiatric/Behavioral: Negative for depression, hallucinations, memory loss, substance abuse and suicidal ideas. The patient is not nervous/anxious and does not have insomnia.      Physical  Exam: BP 106/71 (BP Location: Left Arm)   Pulse 92   Temp 98.1 F (36.7 C) (Oral)   Resp 16   Ht _0  (1.6 m)   Wt 69.4 kg   LMP 08/26/2019   BMI 27.10 kg/m   Constitutional: Well nourished, well developed female in no acute distress.  HEENT: normal Skin: Warm and dry.  Cardiovascular: Regular rate and rhythm.   Respiratory: Clear to auscultation bilateral. Normal respiratory effort Abdomen: FHT present Back: no CVAT Neuro: DTRs 2+, Cranial nerves grossly intact Psych: Alert and Oriented x3. No memory deficits. Normal mood and affect.   Toco: negative for contractions Fetal well being: 160 bpm, moderate variability, 10x10 accelerations, -decelerations. Appropriate for gestational age.  Consults: None  Significant Findings/ Diagnostic Studies: labs:  Results for KRUTI, HORACEK (MRN [id number]) as of 03/20/2020 20:46  Ref. Range 03/20/2020 19:38  Appearance Latest Ref Range: CLEAR  HAZY (A)  Bilirubin Urine Latest Ref Range: NEGATIVE  NEGATIVE  Color, Urine Latest Ref Range: YELLOW  YELLOW (A)  Glucose, UA Latest Ref Range: NEGATIVE mg/dL >=500 (A)  Hgb urine dipstick Latest Ref Range: NEGATIVE  NEGATIVE  Ketones, ur Latest Ref Range: NEGATIVE mg/dL NEGATIVE  Leukocytes,Ua Latest Ref Range: NEGATIVE  SMALL (A)  Nitrite Latest Ref Range: NEGATIVE  NEGATIVE  pH Latest Ref Range: 5.0 - 8.0  7.0  Protein Latest Ref Range: NEGATIVE mg/dL NEGATIVE  Specific Gravity, Urine Latest Ref Range: 1.005 - 1.030  1.011  Bacteria, UA Latest Ref Range: NONE SEEN  MANY (A)  Mucus Unknown PRESENT  RBC / HPF Latest Ref Range: 0 - 5 RBC/hpf 0-5  Squamous Epithelial / LPF Latest Ref Range: 0 - 5  0-5  WBC, UA Latest Ref Range: 0 - 5 WBC/hpf 0-5    Procedures: NST  Hospital Course: The patient was admitted to Labor and Delivery Triage for observation.   Discharge Condition: good  Disposition: Discharge disposition: 01-Home or Self Care  Diet: Regular diet  Discharge Activity:  Activity as tolerated  Discharge Instructions    Discharge activity:  No Restrictions   Complete by: As directed    Discharge diet:  No restrictions   Complete by: As directed    Increase hydration- urine should be clear to light yellow   No sexual activity restrictions   Complete by: As directed    Notify physician for a general feeling that "something is not right"   Complete by: As directed    Notify physician for increase or change in vaginal discharge   Complete by: As directed    Notify physician for intestinal cramps, with or without diarrhea, sometimes described as "gas pain"   Complete by: As directed    Notify physician for leaking of fluid   Complete by: As directed    Notify physician for low, dull backache, unrelieved by heat or Tylenol   Complete by: As directed    Notify physician for menstrual like cramps   Complete by: As directed    Notify physician for pelvic pressure   Complete by: As directed    Notify physician for uterine contractions.  These may be painless and feel like the uterus is tightening or the baby is  "balling up"   Complete by: As directed    Notify physician for vaginal bleeding   Complete by: As directed    PRETERM LABOR:  Includes any of the follwing symptoms that occur between 20 - [redacted] weeks gestation.  If these symptoms are not stopped, preterm labor can result in preterm delivery, placing your baby at risk   Complete by: As directed      Allergies as of 03/20/2020      Reactions   Flexeril [cyclobenzaprine]    Agitation and disorientation   Amoxicillin Hives   Tylenol With Codeine #3 [acetaminophen-codeine] Other (See Comments)   emesis   Augmentin [amoxicillin-pot Clavulanate] Rash      Medication List    STOP taking these medications   diphenhydrAMINE 25 mg capsule Commonly known as: BENADRYL   hydrOXYzine 25 MG tablet Commonly known as: ATARAX/VISTARIL   predniSONE 10 MG (21) Tbpk tablet Commonly known as: STERAPRED UNI-PAK  21 TAB     TAKE these medications   Accu-Chek Nano SmartView w/Device Kit 1 kit by Subdermal route as directed. Check blood sugars for fasting, and two hours after breakfast, lunch and dinner (4 checks daily)   Accu-Chek Softclix Lancets lancets 1 each by Other route 4 (four) times daily. Use as instructed   glucose blood test strip  Use as instructed   metoCLOPramide 10 MG tablet Commonly known as: REGLAN Take 1 tablet (10 mg total) by mouth every 6 (six) hours as needed for nausea or vomiting.   ondansetron 8 MG tablet Commonly known as: ZOFRAN Take 8 mg by mouth every 8 (eight) hours as needed for nausea or vomiting.   promethazine 25 MG suppository Commonly known as: Phenergan Place 1 suppository (25 mg total) rectally every 6 (six) hours as needed for up to 7 days for nausea. What changed: Another medication with the same name was removed. Continue taking this medication, and follow the directions you see here.   ursodiol 500 MG tablet Commonly known as: ACTIGALL Take 1 tablet (500 mg total) by mouth 2 (two) times daily.       Adrian. Go to.   Specialty: Obstetrics and Gynecology Why: scheduled prenatal appointment Contact information: [address] [place] [address] [phone]              Total time spent taking care of this patient: 15 minutes  Signed: Rod Can, CNM  03/20/2020, 8:36 PM

## 2020-03-20 NOTE — OB Triage Note (Signed)
Pt is G3P0 and [redacted]w[redacted]d presenting to L&D with c/o "possible UTI." Pt states symptoms are "achy in lower abdomen like a dull soreness, but no cramps." Pt states she has been urinating more often. Pt denies pelvic pressure and fullness. Pt states urine has been darker than her baseline. Pt confirms positive fetal movement and denies LOF and VB. VSS. Monitors applied and assessing.

## 2020-03-22 LAB — URINE CULTURE: Culture: <10000 — AB

## 2020-03-23 ENCOUNTER — Other Ambulatory Visit: Payer: Self-pay

## 2020-03-23 ENCOUNTER — Ambulatory Visit (INDEPENDENT_AMBULATORY_CARE_PROVIDER_SITE_OTHER): Payer: Medicaid Other | Admitting: Obstetrics and Gynecology

## 2020-03-23 ENCOUNTER — Encounter: Payer: Self-pay | Admitting: Obstetrics and Gynecology

## 2020-03-23 ENCOUNTER — Ambulatory Visit (INDEPENDENT_AMBULATORY_CARE_PROVIDER_SITE_OTHER): Payer: Medicaid Other

## 2020-03-23 VITALS — BP 122/70 | Wt 155.0 lb

## 2020-03-23 DIAGNOSIS — K831 Obstruction of bile duct: Secondary | ICD-10-CM

## 2020-03-23 DIAGNOSIS — O4402 Placenta previa specified as without hemorrhage, second trimester: Secondary | ICD-10-CM

## 2020-03-23 DIAGNOSIS — O0993 Supervision of high risk pregnancy, unspecified, third trimester: Secondary | ICD-10-CM | POA: Diagnosis not present

## 2020-03-23 DIAGNOSIS — O26613 Liver and biliary tract disorders in pregnancy, third trimester: Secondary | ICD-10-CM | POA: Diagnosis not present

## 2020-03-23 DIAGNOSIS — Z3A29 29 weeks gestation of pregnancy: Secondary | ICD-10-CM

## 2020-03-23 NOTE — Progress Notes (Signed)
Routine Prenatal Care Visit  Subjective  Adriana Kerr is a 20 y.o. G3P0020 at [redacted]w[redacted]d being seen today for ongoing prenatal care.  She is currently monitored for the following issues for this high-risk pregnancy and has Encounter for supervision of high risk pregnancy in third trimester, antepartum; Hyperemesis affecting pregnancy, antepartum; Migraine without status migrainosus, not intractable; Patellar instability of left knee; Patellar instability of right knee; Patellofemoral dysfunction of right knee; Radioulnar synostosis of left upper extremity; Placenta previa antepartum in second trimester; Itching; Intrahepatic cholestasis of pregnancy in second trimester; Abdominal pain during pregnancy in third trimester; [redacted] weeks gestation of pregnancy; and Bacteriuria during pregnancy in third trimester on their problem list.  ----------------------------------------------------------------------------------- Patient reports that her itching is better and that she was unable to tolerate taking ursodiol due to it causing her bad headaches.     Contractions: Not present. Vag. Bleeding: None.  Movement: Present. Leaking Fluid denies.  U/S today shows AFI 26 cm.   She declines NST today due to time constraints.  ----------------------------------------------------------------------------------- The following portions of the patient's history were reviewed and updated as appropriate: allergies, current medications, past family history, past medical history, past social history, past surgical history and problem list. Problem list updated.  Objective  Blood pressure 122/70, weight 155 lb (70.3 kg), last menstrual period 08/26/2019. Pregravid weight 171 lb (77.6 kg) Total Weight Gain -16 lb (-7.258 kg) Urinalysis: Urine Protein    Urine Glucose    Fetal Status: Fetal Heart Rate (bpm): 160   Movement: Present     General:  Alert, oriented and cooperative. Patient is in no acute distress.  Skin: Skin  is warm and dry. No rash noted.   Cardiovascular: Normal heart rate noted  Respiratory: Normal respiratory effort, no problems with respiration noted  Abdomen: Soft, gravid, appropriate for gestational age. Pain/Pressure: Absent     Pelvic:  Cervical exam deferred        Extremities: Normal range of motion.  Edema: None  Mental Status: Normal mood and affect. Normal behavior. Normal judgment and thought content.   Imaging Results US OB Limited  Result Date: 03/23/2020 Patient Name: Adriana Kerr DOB: [birth year] MRN: [id number] ULTRASOUND REPORT Location: [facility] Date of Service: 03/23/2020 Indications:AFI Findings: Mason Jim intrauterine pregnancy is visualized with FHR at 163 BPM. Fetal presentation is Cephalic. Placenta: anterior. Grade: 2 AFI: 26.7 cm Impression: 1. [redacted]w[redacted]d Viable Singleton Intrauterine pregnancy dated by previously established criteria. 2. AFI is 26.7 cm. Recommendations: 1.Clinical correlation with the patient's History and Physical Exam. 2. Follow up AFI in 2 weeks, given mild polyhydramnios. Deanna Artis, RT The ultrasound images and findings were reviewed by me and I agree with the above report. Thomasene Mohair, MD, [facility], [facility] 03/23/2020 4:33 PM   .    Assessment   19 y.o. [id number] at [redacted]w[redacted]d by  06/06/2020, by Ultrasound presenting for routine prenatal visit  Plan   Pregnancy1 Problems (from 01/29/20 to present)    Problem Noted Resolved   Hyperemesis affecting pregnancy, antepartum 02/24/2020 by Natale Milch, MD No   Placenta previa antepartum in second trimester 02/24/2020 by Zipporah Plants, CNM No   Encounter for supervision of high risk pregnancy in third trimester, antepartum 01/29/2020 by Christeen Douglas, MD No   Overview Addendum 03/16/2020  1:59 PM by Zipporah Plants, CNM    Clinic [facility] Prenatal Labs (NOB labs at [facility] on 11/13/19)  Dating  7w [place] Blood type: --/--/A POS (01/04 1907)  Genetic Screen  NIPS: negx3,  xx Antibody:NEG (01/04 1907)  Anatomic US  anatomy wnl - marginal placental previa - needs repeat at 28 weeks Rubella:   immune  Varicella: immune  GTT  Third trimester: desires glucose monitoring as alternative option RPR:   non-reactive  Rhogam  n/a HBsAg:   non-reactive  TDaP vaccine                       Flu Shot: declines HIV:   negative  Baby Food  breast              GBS:   Contraception  BTL if cesarean Pap: n/a (<21 yo)  CBB     CS/VBAC    Support Person  Ryan     Pregnancy Diagnoses  ICP (bile acids 28 on 02/28/20)  Per Dr. Grace Bushy (MFM) - weekly NST/AFI until 32 weeks - then weekly BPP/NST        Previous Version       Preterm labor symptoms and general obstetric precautions including but not limited to vaginal bleeding, contractions, leaking of fluid and fetal movement were reviewed in detail with the patient. Please refer to After Visit Summary for other counseling recommendations.   - discussed that there are no alternative medications to ursodiol in pregnancy.  There are some medications that might help some with itching. However, studies do not support their efficacy.   - Will recheck the bile acid level once she is fasting.  - ultrasound to recheck AFI and growth in [redacted] weeks along with an NST.  Discussed that mild polyhydramnios is often not attributable to any known etiology.  Will monitor closely, given her ICP status. Delivery timing will ultimately be determined by her clinical course and bile acid levels, which should be followed.   - All questions answered.   Return in about 2 weeks (around 04/06/2020) for u/s for growth/afi, routine prenatal/NST, schedule lab for any AM in next two weeks.   Thomasene Mohair, MD, [facility], [facility] 03/24/2020 12:29 PM

## 2020-03-24 ENCOUNTER — Encounter: Payer: Self-pay | Admitting: Obstetrics and Gynecology

## 2020-04-02 ENCOUNTER — Other Ambulatory Visit: Payer: Medicaid Other

## 2020-04-02 ENCOUNTER — Other Ambulatory Visit: Payer: Self-pay

## 2020-04-02 DIAGNOSIS — K831 Obstruction of bile duct: Secondary | ICD-10-CM

## 2020-04-02 DIAGNOSIS — O0993 Supervision of high risk pregnancy, unspecified, third trimester: Secondary | ICD-10-CM

## 2020-04-04 LAB — BILE ACIDS, TOTAL: Bile Acids Total: 5.4 umol/L (ref 0.0–10.0)

## 2020-04-06 ENCOUNTER — Encounter: Payer: Self-pay | Admitting: Advanced Practice Midwife

## 2020-04-06 ENCOUNTER — Other Ambulatory Visit: Payer: Self-pay

## 2020-04-06 ENCOUNTER — Ambulatory Visit (INDEPENDENT_AMBULATORY_CARE_PROVIDER_SITE_OTHER): Payer: Medicaid Other | Admitting: Advanced Practice Midwife

## 2020-04-06 ENCOUNTER — Ambulatory Visit (INDEPENDENT_AMBULATORY_CARE_PROVIDER_SITE_OTHER): Payer: Medicaid Other

## 2020-04-06 VITALS — BP 120/80 | Wt 161.0 lb

## 2020-04-06 DIAGNOSIS — K831 Obstruction of bile duct: Secondary | ICD-10-CM | POA: Diagnosis not present

## 2020-04-06 DIAGNOSIS — Z3A31 31 weeks gestation of pregnancy: Secondary | ICD-10-CM | POA: Diagnosis not present

## 2020-04-06 DIAGNOSIS — O26613 Liver and biliary tract disorders in pregnancy, third trimester: Secondary | ICD-10-CM

## 2020-04-06 DIAGNOSIS — O0993 Supervision of high risk pregnancy, unspecified, third trimester: Secondary | ICD-10-CM | POA: Diagnosis not present

## 2020-04-06 LAB — POCT URINALYSIS DIPSTICK OB: Glucose, UA: NEGATIVE

## 2020-04-06 NOTE — Progress Notes (Signed)
Routine Prenatal Care Visit  Subjective  Adriana Kerr is a 20 y.o. G3P0020 at [redacted]w[redacted]d being seen today for ongoing prenatal care.  She is currently monitored for the following issues for this high-risk pregnancy and has Encounter for supervision of high risk pregnancy in third trimester, antepartum; Hyperemesis affecting pregnancy, antepartum; Migraine without status migrainosus, not intractable; Patellar instability of left knee; Patellar instability of right knee; Patellofemoral dysfunction of right knee; Radioulnar synostosis of left upper extremity; Placenta previa antepartum in second trimester; Itching; Intrahepatic cholestasis of pregnancy in second trimester; Abdominal pain during pregnancy in third trimester; [redacted] weeks gestation of pregnancy; and Bacteriuria during pregnancy in third trimester on their problem list.  ----------------------------------------------------------------------------------- Patient reports generally doing well. She does wake up vomiting still every morning. She has much less itching in the past 2 weeks.   Contractions: Not present. Vag. Bleeding: None.  Movement: Present. Leaking Fluid denies.  ----------------------------------------------------------------------------------- The following portions of the patient's history were reviewed and updated as appropriate: allergies, current medications, past family history, past medical history, past social history, past surgical history and problem list. Problem list updated.  Objective  Blood pressure 120/80, weight 161 lb (73 kg), last menstrual period 08/26/2019. Pregravid weight 171 lb (77.6 kg) Total Weight Gain -10 lb (-4.536 kg) Urinalysis: Urine Protein Small (1+)  Urine Glucose Negative  Fetal Status: Fetal Heart Rate (bpm): 145   Movement: Present  Presentation: Vertex   Growth: 50.3%, AC 64.8%, 3#14oz, cephalic, AFI 26.4 cm NST: reactive 20 minute tracing, 145 bpm baseline, moderate variability,  +accelerations, -decelerations  BG log: per patient report all levels are wnl Fasting range from 78-90 After meals 90s to 118  General:  Alert, oriented and cooperative. Patient is in no acute distress.  Skin: Skin is warm and dry. No rash noted.   Cardiovascular: Normal heart rate noted  Respiratory: Normal respiratory effort, no problems with respiration noted  Abdomen: Soft, gravid, appropriate for gestational age. Pain/Pressure: Absent     Pelvic:  Cervical exam deferred        Extremities: Normal range of motion.  Edema: None  Mental Status: Normal mood and affect. Normal behavior. Normal judgment and thought content.   Assessment   19 y.o. [id number] at [redacted]w[redacted]d by  06/06/2020, by Ultrasound presenting for routine prenatal visit  Plan   Pregnancy1 Problems (from 01/29/20 to present)    Problem Noted Resolved   Hyperemesis affecting pregnancy, antepartum 02/24/2020 by Natale Milch, MD No   Placenta previa antepartum in second trimester 02/24/2020 by Zipporah Plants, CNM No   Encounter for supervision of high risk pregnancy in third trimester, antepartum 01/29/2020 by Christeen Douglas, MD No   Overview Addendum 03/16/2020  1:59 PM by Zipporah Plants, CNM    Clinic [facility] Prenatal Labs (NOB labs at [facility] on 11/13/19)  Dating  7w [place] Blood type: --/--/A POS (01/04 1907)   Genetic Screen  NIPS: negx3, xx Antibody:NEG (01/04 1907)  Anatomic US  anatomy wnl - marginal placental previa - needs repeat at 28 weeks Rubella:   immune  Varicella: immune  GTT  Third trimester: desires glucose monitoring as alternative option RPR:   non-reactive  Rhogam  n/a HBsAg:   non-reactive  TDaP vaccine                       Flu Shot: declines HIV:   negative  Baby Food  breast  GBS:   Contraception  BTL if cesarean Pap: n/a (<21 yo)  CBB     CS/VBAC    Support Person  Ryan     Pregnancy Diagnoses  ICP (bile acids 28 on 02/28/20)  Per Dr. Grace Bushy (MFM) - weekly NST/AFI until 32 weeks - then  weekly BPP/NST        Previous Version    Bile acids 5.4 on 2/11   Preterm labor symptoms and general obstetric precautions including but not limited to vaginal bleeding, contractions, leaking of fluid and fetal movement were reviewed in detail with the patient.    Return in about 2 weeks (around 04/20/2020) for afi/nst/rob with sdj  Tresea Mall, CNM 04/06/2020 2:11 PM

## 2020-04-13 ENCOUNTER — Observation Stay
Admission: EM | Admit: 2020-04-13 | Discharge: 2020-04-13 | Disposition: A | Discharge status: Home / Self Care | Payer: Medicaid Other | Attending: Obstetrics and Gynecology | Admitting: Obstetrics and Gynecology

## 2020-04-13 ENCOUNTER — Emergency Department
Admission: EM | Admit: 2020-04-13 | Discharge: 2020-04-13 | Discharge status: Against Medical Advice | Payer: Medicaid Other | Attending: Emergency Medicine | Admitting: Emergency Medicine

## 2020-04-13 ENCOUNTER — Other Ambulatory Visit: Payer: Self-pay

## 2020-04-13 ENCOUNTER — Encounter: Payer: Self-pay | Admitting: Obstetrics and Gynecology

## 2020-04-13 DIAGNOSIS — Z7722 Contact with and (suspected) exposure to environmental tobacco smoke (acute) (chronic): Secondary | ICD-10-CM | POA: Insufficient documentation

## 2020-04-13 DIAGNOSIS — K922 Gastrointestinal hemorrhage, unspecified: Secondary | ICD-10-CM | POA: Insufficient documentation

## 2020-04-13 DIAGNOSIS — K920 Hematemesis: Secondary | ICD-10-CM | POA: Diagnosis not present

## 2020-04-13 DIAGNOSIS — O99613 Diseases of the digestive system complicating pregnancy, third trimester: Secondary | ICD-10-CM | POA: Diagnosis not present

## 2020-04-13 DIAGNOSIS — Z3A33 33 weeks gestation of pregnancy: Secondary | ICD-10-CM | POA: Insufficient documentation

## 2020-04-13 DIAGNOSIS — O99891 Other specified diseases and conditions complicating pregnancy: Secondary | ICD-10-CM | POA: Diagnosis not present

## 2020-04-13 DIAGNOSIS — R109 Unspecified abdominal pain: Secondary | ICD-10-CM

## 2020-04-13 DIAGNOSIS — O0993 Supervision of high risk pregnancy, unspecified, third trimester: Secondary | ICD-10-CM

## 2020-04-13 DIAGNOSIS — R1110 Vomiting, unspecified: Secondary | ICD-10-CM | POA: Diagnosis present

## 2020-04-13 DIAGNOSIS — Z3A32 32 weeks gestation of pregnancy: Secondary | ICD-10-CM | POA: Diagnosis not present

## 2020-04-13 DIAGNOSIS — O218 Other vomiting complicating pregnancy: Secondary | ICD-10-CM | POA: Diagnosis not present

## 2020-04-13 DIAGNOSIS — O210 Mild hyperemesis gravidarum: Secondary | ICD-10-CM

## 2020-04-13 DIAGNOSIS — O4402 Placenta previa specified as without hemorrhage, second trimester: Secondary | ICD-10-CM

## 2020-04-13 LAB — CBC
HCT: 30.9 % — ABNORMAL LOW (ref 36.0–46.0)
Hemoglobin: 10.3 g/dL — ABNORMAL LOW (ref 12.0–15.0)
MCH: 29.1 pg (ref 26.0–34.0)
MCHC: 33.3 g/dL (ref 30.0–36.0)
MCV: 87.3 fL (ref 80.0–100.0)
Platelets: 191 10*3/uL (ref 150–400)
RBC: 3.54 MIL/uL — ABNORMAL LOW (ref 3.87–5.11)
RDW: 12.7 % (ref 11.5–15.5)
WBC: 6.1 10*3/uL (ref 4.0–10.5)
nRBC: 0 % (ref 0.0–0.2)

## 2020-04-13 LAB — COMPREHENSIVE METABOLIC PANEL
ALT: 12 U/L (ref 0–44)
AST: 18 U/L (ref 15–41)
Albumin: 3 g/dL — ABNORMAL LOW (ref 3.5–5.0)
Alkaline Phosphatase: 154 U/L — ABNORMAL HIGH (ref 38–126)
Anion gap: 8 (ref 5–15)
BUN: <5 mg/dL — ABNORMAL LOW (ref 6–20)
CO2: 22 mmol/L (ref 22–32)
Calcium: 8.9 mg/dL (ref 8.9–10.3)
Chloride: 104 mmol/L (ref 98–111)
Creatinine, Ser: 0.53 mg/dL (ref 0.44–1.00)
GFR, Estimated: >60 mL/min (ref >60)
Glucose, Bld: 77 mg/dL (ref 70–99)
Potassium: 3.8 mmol/L (ref 3.5–5.1)
Sodium: 134 mmol/L — ABNORMAL LOW (ref 135–145)
Total Bilirubin: 0.5 mg/dL (ref 0.3–1.2)
Total Protein: 6.5 g/dL (ref 6.5–8.1)

## 2020-04-13 LAB — LIPASE, BLOOD: Lipase: 32 U/L (ref 11–51)

## 2020-04-13 NOTE — ED Triage Notes (Signed)
Pt comes via POV from home with c/o vomiting blood this am. Pt states she had multiple episodes.  Pt is [redacted] weeks pregnant and came here today and was evaluated upstairs by OB. Per OB they want her checked out in ED.  Pt states little belly pain.

## 2020-04-13 NOTE — ED Notes (Addendum)
Pt ambulatory to toilet with steady gait noted. Husband at bedside.

## 2020-04-13 NOTE — OB Triage Note (Signed)
Patient arrived for complaints of vomiting blood. Patient has had vomiting most of pregnancy but this morning woke up and has vomited 3 different times and has total over a cup of bright red blood with the last one being very "tar" like. Pt denies any other issues at this time, baby is moving like normal, no LOF, vaginal bleeding, or any other concerns. Pt says abdomen is sore- she thinks due to vomiting.

## 2020-04-13 NOTE — Progress Notes (Signed)
Patient discharge instructions given, pt verbilizes understanding. Pt sent back to ER triage for further evaluation per MD via wheelchair. LD RN called report to ER charge and triage nurses.

## 2020-04-13 NOTE — ED Provider Notes (Signed)
[facility] Emergency Department Provider Note  ____________________________________________   Event Date/Time   First MD Initiated Contact with Patient 04/13/20 1028     (approximate)  I have reviewed the triage vital signs and the nursing notes.   HISTORY  Chief Complaint Hematemesis    HPI Adriana Kerr is a 20 y.o. female G3 P0-0-2-0 with anxiety depression who comes in for vomiting blood.  Patient is currently pregnant at [redacted] weeks.  Patient is seen by [facility] clinic GI.  She states that they have been testing her for H. pylori and they have been trying different acid reducers although she has difficulty keeping it down.  She is had some coffee ground emesis for the majority of her pregnancy with some small streaks of bright red blood but today was the first time that there was more blood than she was used to.  She states that it was mixed in with food but she thinks it was around a half a cup to a cup.  She denies having it being this much previously.  She reports some mild abdominal pain that her entire pregnancy denies any changes recently.  Denies any urinary symptoms  Patient was seen by the [facility] team and cleared but sent out here for further evaluation. Denies any issues with the baby..  No vaginal bleeding.  Continues moving.          Past Medical History:  Diagnosis Date  . Anxiety   . Depression   . Migraines   . PONV (postoperative nausea and vomiting)     Patient Active Problem List   Diagnosis Date Noted  . Vomiting 04/13/2020  . Abdominal pain during pregnancy in third trimester 03/20/2020  . [redacted] weeks gestation of pregnancy   . Bacteriuria during pregnancy in third trimester   . Intrahepatic cholestasis of pregnancy in second trimester 03/01/2020  . Itching 02/28/2020  . Hyperemesis affecting pregnancy, antepartum 02/24/2020  . Placenta previa antepartum in second trimester 02/24/2020  . Encounter for supervision of high  risk pregnancy in third trimester, antepartum 01/29/2020  . 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 Migraine without status migrainosus, not intractable 11/01/2016  . 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 Patellar instability of right knee 01/05/2014  . Patellofemoral dysfunction of right knee 11/18/2013    Past Surgical History:  Procedure Laterality Date  . ADENOIDECTOMY    . ELBOW SURGERY    . KNEE SURGERY    . TONSILLECTOMY      Prior to Admission medications   Medication Sig Start Date End Date Taking? Authorizing Provider  Accu-Chek Softclix Lancets lancets 1 each by Other route 4 (four) times daily. Use as instructed 03/10/20   Gae Dry, MD  Blood Glucose Monitoring Suppl (ACCU-CHEK NANO SMARTVIEW) w/Device KIT 1 kit by Subdermal route as directed. Check blood sugars for fasting, and two hours after breakfast, lunch and dinner (4 checks daily) 03/10/20   Gae Dry, MD  glucose blood test strip Use as instructed 03/10/20   Gae Dry, MD  metoCLOPramide (REGLAN) 10 MG tablet Take 1 tablet (10 mg total) by mouth every 6 (six) hours as needed for nausea or vomiting. 10/14/19 11/13/19  Vladimir Crofts, MD  ondansetron (ZOFRAN) 8 MG tablet Take 8 mg by mouth every 8 (eight) hours as needed for nausea or vomiting.    [provider]  Prenatal Multivit-Min-Fe-FA (PRENATAL, W/IRON & FA,) 27-0.8 MG TABS Take 1 tablet by  mouth daily. Patient not taking: Reported on 04/13/2020    [provider]  promethazine (PHENERGAN) 25 MG suppository Place 1 suppository (25 mg total) rectally every 6 (six) hours as needed for up to 7 days for nausea. 03/01/20 03/08/20  Orlie Pollen, CNM  ursodiol (ACTIGALL) 500 MG tablet Take 1 tablet (500 mg total) by mouth 2 (two) times daily. 03/01/20   Orlie Pollen, CNM    Allergies Codeine, Flexeril [cyclobenzaprine], Amoxicillin, Tylenol with codeine #3 [acetaminophen-codeine], and Augmentin  [amoxicillin-pot clavulanate]  Family History  Problem Relation Age of Onset  . Ovarian cancer Maternal Grandmother   . Breast cancer Paternal Grandmother 38    Social History Social History   Tobacco Use  . Smoking status: Passive Smoke Exposure - Never Smoker  . Smokeless tobacco: Never Used  Vaping Use  . Vaping Use: Never used  Substance Use Topics  . Alcohol use: No  . Drug use: Yes    Types: Marijuana    Comment: Last use 03/19/20      Review of Systems Constitutional: No fever/chills Eyes: No visual changes. ENT: No sore throat. Cardiovascular: Denies chest pain. Respiratory: Denies shortness of breath. Gastrointestinal: No abdominal pain.  Vomiting blood Genitourinary: Negative for dysuria. Musculoskeletal: Negative for back pain. Skin: Negative for rash. Neurological: Negative for headaches, focal weakness or numbness. All other ROS negative ____________________________________________   PHYSICAL EXAM:  VITAL SIGNS: ED Triage Vitals  Enc Vitals Group     BP 04/13/20 0946 119/84     Pulse Rate 04/13/20 0946 77     Resp 04/13/20 0946 18     Temp 04/13/20 0946 97.7 F (36.5 C)     Temp Source 04/13/20 0946 Oral     SpO2 04/13/20 0946 100 %     Weight --      Height --      Head Circumference --      Peak Flow --      Pain Score 04/13/20 0939 2     Pain Loc --      Pain Edu? --      Excl. in [place]? --     Constitutional: Alert and oriented. Well appearing and in no acute distress. Eyes: Conjunctivae are normal. EOMI. Head: Atraumatic. Nose: No congestion/rhinnorhea. Mouth/Throat: Mucous membranes are moist.   Neck: No stridor. Trachea Midline. FROM Cardiovascular: Normal rate, regular rhythm. Grossly normal heart sounds.  Good peripheral circulation. Respiratory: Normal respiratory effort.  No retractions. Lungs CTAB. Gastrointestinal: Soft and nontender.  Gravid uterus  Musculoskeletal: No lower extremity tenderness nor edema.  No joint  effusions. Neurologic:  Normal speech and language. No gross focal neurologic deficits are appreciated.  Skin:  Skin is warm, dry and intact. No rash noted. Psychiatric: Mood and affect are normal. Speech and behavior are normal. GU: Deferred   ____________________________________________   LABS (all labs ordered are listed, but only abnormal results are displayed)  Labs Reviewed  COMPREHENSIVE METABOLIC PANEL - Abnormal; Notable for the following components:      Result Value   Sodium 134 (*)    BUN <5 (*)    Albumin 3.0 (*)    Alkaline Phosphatase 154 (*)    All other components within normal limits  CBC - Abnormal; Notable for the following components:   RBC 3.54 (*)    Hemoglobin 10.3 (*)    HCT 30.9 (*)    All other components within normal limits  LIPASE, BLOOD  URINALYSIS, COMPLETE (UACMP) WITH MICROSCOPIC  PROCEDURES  Procedure(s) performed (including Critical Care):  Procedures   ____________________________________________   INITIAL IMPRESSION / ASSESSMENT AND PLAN / ED COURSE  SHENELL ROGALSKI was evaluated in Emergency Department on 04/13/2020 for the symptoms described in the history of present illness. She was evaluated in the context of the global COVID-19 pandemic, which necessitated consideration that the patient might be at risk for infection with the SARS-CoV-2 virus that causes COVID-19. Institutional protocols and algorithms that pertain to the evaluation of patients at risk for COVID-19 are in a state of rapid change based on information released by regulatory bodies including the CDC and federal and state organizations. These policies and algorithms were followed during the patient's care in the ED.    Patient is a 20 year old who comes in with vomiting blood in the setting of multiple episodes of vomiting throughout her pregnancy.  Discussed with patient this is most likely secondary Mallory-Weiss tears but given the change in the amount of blood  that this could be something more serious such as ulcer.  Does not drink alcohol and I have low suspicion for variceal bleeding.  Her abdomen was soft and nontender and I have low suspicion for perforation.  Hemoglobin was ordered to evaluate for anemia.  Rectal exam was performed and had brown stool was Hemoccult negative but given the vomiting was just earlier this morning it may not have changed yet.  Hemoglobin is down to 10.3.  Discussed with patient that I would recommend admission for trending out her hemoglobin and discussing with GI for possible endoscopy.  Patient states that she is not interested in this.  She has to get home to help take care of her family kid.  She understands the risk including that this hemoglobin did not accurately represent her vomiting from today and that could actually be lower.  There is also the risk that she could start vomiting again and that people that are vomiting blood can turn very quickly and start having profuse bleeding that leads to death.  There is also a chance that she will continue to bleed and may not even notice if she is not vomiting.  Patient expressed understanding.  Her husband is at bedside and is a witness to my conversation but she is elected to want to leave [place].  She has the capacity to make this decision.  She understands the risk including death and permanent disability.  She understands that she can always return to the ER if the vomiting returns.  She understands that she needs to have a hemoglobin recheck done tomorrow morning.  She also will call her GI doctor to follow-up with       ____________________________________________   FINAL CLINICAL IMPRESSION(S) / ED DIAGNOSES   Final diagnoses:  Upper GI bleed      MEDICATIONS GIVEN DURING THIS VISIT:  Medications - No data to display   ED Discharge Orders    None       Note:  This document was prepared using Dragon voice recognition software and may  include unintentional dictation errors.   Vanessa Wann, MD 04/13/20 [phone]

## 2020-04-13 NOTE — ED Notes (Signed)
EDP at bedside  

## 2020-04-13 NOTE — Discharge Instructions (Signed)
Keep your next scheduled follow up appointment with [facility]. Call your providers for any other concerns.

## 2020-04-13 NOTE — Discharge Instructions (Signed)
Your hemoglobin was 10.3.  We recommended admission for trending hemoglobins and seeing GI to see if you would be a able to get endoscopy.  You have opted to want to leave AGAINST MEDICAL ADVICE.  Please have your hemoglobin rechecked tomorrow morning.  If it is trending down lower you need to return to the ER.  If you start vomiting blood again please return to the ER.  Please call your ER doctor to let them know about the increase in the blood with your vomiting

## 2020-04-15 ENCOUNTER — Telehealth: Payer: Self-pay

## 2020-04-15 NOTE — Telephone Encounter (Signed)
Pt calling; was seen in ED for vomiting blood; hgb 10.3; saw GI yesterday - hgb 11.2; hasn't vomited since; has other abnl labs that she was told to discuss with [place] or can it wait til her appt?  [phone]  Adv pt that labs could wait for her appt.

## 2020-04-20 ENCOUNTER — Other Ambulatory Visit: Payer: Self-pay

## 2020-04-20 ENCOUNTER — Ambulatory Visit (INDEPENDENT_AMBULATORY_CARE_PROVIDER_SITE_OTHER): Payer: Medicaid Other | Admitting: Obstetrics and Gynecology

## 2020-04-20 ENCOUNTER — Ambulatory Visit (INDEPENDENT_AMBULATORY_CARE_PROVIDER_SITE_OTHER): Payer: Medicaid Other

## 2020-04-20 ENCOUNTER — Encounter: Payer: Self-pay | Admitting: Obstetrics and Gynecology

## 2020-04-20 VITALS — BP 118/74 | Wt 160.0 lb

## 2020-04-20 DIAGNOSIS — O0993 Supervision of high risk pregnancy, unspecified, third trimester: Secondary | ICD-10-CM | POA: Diagnosis not present

## 2020-04-20 DIAGNOSIS — O26613 Liver and biliary tract disorders in pregnancy, third trimester: Secondary | ICD-10-CM | POA: Diagnosis not present

## 2020-04-20 DIAGNOSIS — O26643 Intrahepatic cholestasis of pregnancy, third trimester: Secondary | ICD-10-CM

## 2020-04-20 DIAGNOSIS — K831 Obstruction of bile duct: Secondary | ICD-10-CM | POA: Diagnosis not present

## 2020-04-20 DIAGNOSIS — Z3A33 33 weeks gestation of pregnancy: Secondary | ICD-10-CM

## 2020-04-20 DIAGNOSIS — O210 Mild hyperemesis gravidarum: Secondary | ICD-10-CM

## 2020-04-20 DIAGNOSIS — O26612 Liver and biliary tract disorders in pregnancy, second trimester: Secondary | ICD-10-CM

## 2020-04-20 DIAGNOSIS — O26642 Intrahepatic cholestasis of pregnancy, second trimester: Secondary | ICD-10-CM

## 2020-04-20 DIAGNOSIS — K920 Hematemesis: Secondary | ICD-10-CM

## 2020-04-20 DIAGNOSIS — O4402 Placenta previa specified as without hemorrhage, second trimester: Secondary | ICD-10-CM

## 2020-04-20 MED ORDER — PANTOPRAZOLE SODIUM 40 MG PO TBEC: 40.0000 mg | DELAYED_RELEASE_TABLET | Freq: Every day | ORAL | 3 refills | Status: DC

## 2020-04-20 NOTE — Progress Notes (Signed)
Routine Prenatal Care Visit  Subjective  Adriana Kerr is a 20 y.o. G3P0020 at [redacted]w[redacted]d being seen today for ongoing prenatal care.  She is currently monitored for the following issues for this high-risk pregnancy and has Encounter for supervision of high risk pregnancy in third trimester, antepartum; Hyperemesis affecting pregnancy, antepartum; Migraine without status migrainosus, not intractable; Patellar instability of left knee; Patellar instability of right knee; Patellofemoral dysfunction of right knee; Radioulnar synostosis of left upper extremity; Placenta previa antepartum in second trimester; Itching; Intrahepatic cholestasis of pregnancy in second trimester; Abdominal pain during pregnancy in third trimester; [redacted] weeks gestation of pregnancy; Bacteriuria during pregnancy in third trimester; Vomiting; and Hematemesis on their problem list.  ----------------------------------------------------------------------------------- Patient reports no complaints.   Contractions: Not present. Vag. Bleeding: None.  Movement: Present. Leaking Fluid denies.  Went to ER for hematemesis and is doing better. Saw GI. Recommended PPI.  BG values all normal. No sx of itching. ----------------------------------------------------------------------------------- The following portions of the patient's history were reviewed and updated as appropriate: allergies, current medications, past family history, past medical history, past social history, past surgical history and problem list. Problem list updated.  Objective  Blood pressure 118/74, weight 160 lb (72.6 kg), last menstrual period 08/26/2019. Pregravid weight 171 lb (77.6 kg) Total Weight Gain -11 lb (-4.99 kg) Urinalysis: Urine Protein    Urine Glucose    Fetal Status: Fetal Heart Rate (bpm): 140   Movement: Present     General:  Alert, oriented and cooperative. Patient is in no acute distress.  Skin: Skin is warm and dry. No rash noted.    Cardiovascular: Normal heart rate noted  Respiratory: Normal respiratory effort, no problems with respiration noted  Abdomen: Soft, gravid, appropriate for gestational age. Pain/Pressure: Absent     Pelvic:  Cervical exam deferred        Extremities: Normal range of motion.     Mental Status: Normal mood and affect. Normal behavior. Normal judgment and thought content.   NST: Baseline FHR: 140 beats/min Variability: moderate Accelerations: present Decelerations: absent Tocometry: not done  Interpretation:  INDICATIONS: intrahepatic cholestasis of pregnancy RESULTS:  A NST procedure was performed with FHR monitoring and a normal baseline established, appropriate time of 20-40 minutes of evaluation, and accels >2 seen w 15x15 characteristics.  Results show a REACTIVE NST.    Imaging Results US OB Limited  Result Date: 04/20/2020 Patient Name: OLUWATAMILORE STARNES DOB: 2001/01/19 MRN: [id number] ULTRASOUND REPORT Location: [facility] Date of Service: 04/20/2020 Indications: AFI Findings: Mason Jim intrauterine pregnancy is visualized with FHR at 144 BPM. Fetal presentation is Cephalic. Placenta: anterior. Grade: 2 AFI: 21.5 cm with DVP 8.4cm. Impression: 1. [redacted]w[redacted]d Viable Singleton Intrauterine pregnancy dated by previously established criteria. 2. AFI is 21.5 cm. Darlina Guys, RT The ultrasound images and findings were reviewed by me and I agree with the above report. Thomasene Mohair, MD, [facility], [facility] 04/20/2020 1:51 PM       Assessment   19 y.o. [id number] at [redacted]w[redacted]d by  06/06/2020, by Ultrasound presenting for routine prenatal visit  Plan   Pregnancy1 Problems (from 01/29/20 to present)    Problem Noted Resolved   Hematemesis 04/20/2020 by Conard Novak, MD No   Hyperemesis affecting pregnancy, antepartum 02/24/2020 by Natale Milch, MD No   Placenta previa antepartum in second trimester 02/24/2020 by Zipporah Plants, CNM No   Encounter for  supervision of high risk pregnancy in third trimester, antepartum 01/29/2020 by Dalbert Garnet,  Toma Copier, MD No   Overview Addendum 03/16/2020  1:59 PM by Zipporah Plants, CNM    Clinic [facility] Prenatal Labs (NOB labs at [facility] on 11/13/19)  Dating  7w [place] Blood type: --/--/A POS (01/04 1907)   Genetic Screen  NIPS: negx3, xx Antibody:NEG (01/04 1907)  Anatomic US  anatomy wnl - marginal placental previa - needs repeat at 28 weeks Rubella:   immune  Varicella: immune  GTT  Third trimester: desires glucose monitoring as alternative option RPR:   non-reactive  Rhogam  n/a HBsAg:   non-reactive  TDaP vaccine                       Flu Shot: declines HIV:   negative  Baby Food  breast              GBS:   Contraception  BTL if cesarean Pap: n/a (<21 yo)  CBB     CS/VBAC    Support Person  Ryan     Pregnancy Diagnoses  ICP (bile acids 28 on 02/28/20)  Per Dr. Grace Bushy (MFM) - weekly NST/AFI until 32 weeks - then weekly BPP/NST        Previous Version       Preterm labor symptoms and general obstetric precautions including but not limited to vaginal bleeding, contractions, leaking of fluid and fetal movement were reviewed in detail with the patient. Please refer to After Visit Summary for other counseling recommendations.   - add PPI for hematemesis.    Return in about 1 week (around 04/27/2020) for Routine Prenatal Appointment/NST, fasting lab when able.   Thomasene Mohair, MD, [facility], [facility] 04/20/2020 2:16 PM

## 2020-04-21 DIAGNOSIS — Z3A32 32 weeks gestation of pregnancy: Secondary | ICD-10-CM

## 2020-04-21 NOTE — Discharge Summary (Signed)
Patient presented for NST due to hematemesis. Per the RN she reported +FM, no LOF, no vaginal bleeding, no contractions.  NST Baseline FHR: 140 beats/min Variability: moderate Accelerations: present Decelerations: absent Tocometry: quiet  Interpretation:  INDICATIONS: hematemesis, abdominal pain RESULTS:  A NST procedure was performed with FHR monitoring and a normal baseline established, appropriate time of 20-40 minutes of evaluation, and accels >2 seen w 15x15 characteristics.  Results show a REACTIVE NST.      Thomasene Mohair, MD, [facility], [facility] 04/21/2020 10:05 AM

## 2020-04-23 ENCOUNTER — Encounter: Payer: Self-pay | Admitting: Obstetrics & Gynecology

## 2020-04-23 ENCOUNTER — Ambulatory Visit (INDEPENDENT_AMBULATORY_CARE_PROVIDER_SITE_OTHER): Payer: Medicaid Other | Admitting: Obstetrics & Gynecology

## 2020-04-23 ENCOUNTER — Other Ambulatory Visit: Payer: Self-pay

## 2020-04-23 VITALS — BP 120/80 | Wt 161.0 lb

## 2020-04-23 DIAGNOSIS — O368130 Decreased fetal movements, third trimester, not applicable or unspecified: Secondary | ICD-10-CM | POA: Diagnosis not present

## 2020-04-23 DIAGNOSIS — Z3A33 33 weeks gestation of pregnancy: Secondary | ICD-10-CM

## 2020-04-23 NOTE — Progress Notes (Signed)
  Subjective  Pt is a 20 yo G3P0020 at 56 5/7 weeks w decreased fetal movement this week, since her NST and BPP was reactive and reassuring on Tuesday.  She feels daily movements just less than what she is used to feeling.  No pain, bleeding, change in diet, infection, fever, change in bladder or bowel habits.    Objective  BP 120/80   Wt 161 lb (73 kg)   LMP 08/26/2019   BMI 28.52 kg/m  General: NAD Pumonary: no increased work of breathing Abdomen: gravid, non-tender Extremities: no edema Psychiatric: mood appropriate, affect full FHT 140s  Assessment  19 y.o. G3P0020 at [redacted]w[redacted]d by  06/06/2020, by Ultrasound presenting for routine prenatal visit  Plan   Problem List Items Addressed This Visit     Decreased fetal movements in third trimester, single or unspecified fetus    -  Primary   [redacted] weeks gestation of pregnancy        Reassured patient today and counseled as to fetal kick counts and as to the predictive benefit of NST and BPP testing. Hydrate, rest, and monitor activity this weekend, keep appt next week.  A total of 20 minutes were spent face-to-face with the patient as well as preparation, review, communication, and documentation during this encounter.   Annamarie Major, MD, [facility], [facility] 04/23/2020  3:06 PM

## 2020-04-27 ENCOUNTER — Ambulatory Visit (INDEPENDENT_AMBULATORY_CARE_PROVIDER_SITE_OTHER): Payer: Medicaid Other | Admitting: Obstetrics

## 2020-04-27 ENCOUNTER — Other Ambulatory Visit: Payer: Self-pay

## 2020-04-27 VITALS — BP 118/78 | Wt 160.0 lb

## 2020-04-27 DIAGNOSIS — O0993 Supervision of high risk pregnancy, unspecified, third trimester: Secondary | ICD-10-CM

## 2020-04-27 DIAGNOSIS — O26612 Liver and biliary tract disorders in pregnancy, second trimester: Secondary | ICD-10-CM

## 2020-04-27 DIAGNOSIS — K831 Obstruction of bile duct: Secondary | ICD-10-CM

## 2020-04-27 DIAGNOSIS — Z3A34 34 weeks gestation of pregnancy: Secondary | ICD-10-CM

## 2020-04-27 NOTE — Progress Notes (Signed)
NST today. No vb. No lof.  °

## 2020-04-27 NOTE — Progress Notes (Signed)
Routine Prenatal Care Visit  Subjective  Adriana Kerr is a 20 y.o. G3P0020 at [redacted]w[redacted]d being seen today for ongoing prenatal care.  She is currently monitored for the following issues for this high-risk pregnancy and has Encounter for supervision of high risk pregnancy in third trimester, antepartum; Hyperemesis affecting pregnancy, antepartum; Migraine without status migrainosus, not intractable; Patellar instability of left knee; Patellar instability of right knee; Patellofemoral dysfunction of right knee; Radioulnar synostosis of left upper extremity; Placenta previa antepartum in second trimester; Itching; Intrahepatic cholestasis of pregnancy in second trimester; Abdominal pain during pregnancy in third trimester; [redacted] weeks gestation of pregnancy; Bacteriuria during pregnancy in third trimester; Vomiting; Hematemesis; and [redacted] weeks gestation of pregnancy on their problem list.  ----------------------------------------------------------------------------------- Patient reports no complaints.  She is taking Actigall. Contractions: Not present. Vag. Bleeding: None.  Movement: Present. Leaking Fluid denies.  ----------------------------------------------------------------------------------- The following portions of the patient's history were reviewed and updated as appropriate: allergies, current medications, past family history, past medical history, past social history, past surgical history and problem list. Problem list updated.  Objective  Blood pressure 118/78, weight 160 lb (72.6 kg), last menstrual period 08/26/2019. Pregravid weight 171 lb (77.6 kg) Total Weight Gain -11 lb (-4.99 kg) Urinalysis: Urine Protein    Urine Glucose    Fetal Status:     Movement: Present     General:  Alert, oriented and cooperative. Patient is in no acute distress.  Skin: Skin is warm and dry. No rash noted.   Cardiovascular: Normal heart rate noted  Respiratory: Normal respiratory effort, no problems  with respiration noted  Abdomen: Soft, gravid, appropriate for gestational age. Pain/Pressure: Absent     Pelvic:  Cervical exam deferred        Extremities: Normal range of motion.     Mental Status: Normal mood and affect. Normal behavior. Normal judgment and thought content.   Assessment   20 y.o. [id number] at [redacted]w[redacted]d by  06/06/2020, by Ultrasound presenting for routine prenatal visit  Plan   Pregnancy1 Problems (from 01/29/20 to present)    Problem Noted Resolved   Hematemesis 04/20/2020 by Conard Novak, MD No   Hyperemesis affecting pregnancy, antepartum 02/24/2020 by Natale Milch, MD No   Placenta previa antepartum in second trimester 02/24/2020 by Zipporah Plants, CNM No   Encounter for supervision of high risk pregnancy in third trimester, antepartum 01/29/2020 by Christeen Douglas, MD No   Overview Addendum 03/16/2020  1:59 PM by Zipporah Plants, CNM    Clinic [facility] Prenatal Labs (NOB labs at [facility] on 11/13/19)  Dating  7w [place] Blood type: --/--/A POS (01/04 1907)   Genetic Screen  NIPS: negx3, xx Antibody:NEG (01/04 1907)  Anatomic US  anatomy wnl - marginal placental previa - needs repeat at 28 weeks Rubella:   immune  Varicella: immune  GTT  Third trimester: desires glucose monitoring as alternative option RPR:   non-reactive  Rhogam  n/a HBsAg:   non-reactive  TDaP vaccine                       Flu Shot: declines HIV:   negative  Baby Food  breast              GBS:   Contraception  BTL if cesarean Pap: n/a (<21 yo)  CBB     CS/VBAC    Support Person  Ryan     Pregnancy Diagnoses  ICP (bile acids 28 on 02/28/20)  Per Dr.  Booker (MFM) - weekly NST/AFI until 32 weeks - then weekly BPP/NST        Previous Version       Preterm labor symptoms and general obstetric precautions including but not limited to vaginal bleeding, contractions, leaking of fluid and fetal movement were reviewed in detail with the patient. Please refer to After Visit Summary for other counseling  recommendations.  Her NTt today is reactive, with accels present and decels absent. Moderate variability. She is due for a repeat lab draw. Continue weekly NSTs  Return in about 1 week (around 05/04/2020) for return OB, NST (cholestasis).  Mirna Mires, CNM  04/27/2020 2:04 PM

## 2020-04-29 ENCOUNTER — Other Ambulatory Visit: Payer: Self-pay | Admitting: Obstetrics

## 2020-04-29 ENCOUNTER — Ambulatory Visit: Payer: Medicaid Other | Attending: Obstetrics

## 2020-04-29 ENCOUNTER — Other Ambulatory Visit: Payer: Medicaid Other

## 2020-04-29 ENCOUNTER — Other Ambulatory Visit: Payer: Self-pay

## 2020-04-29 DIAGNOSIS — O26613 Liver and biliary tract disorders in pregnancy, third trimester: Secondary | ICD-10-CM | POA: Diagnosis not present

## 2020-04-29 DIAGNOSIS — O0993 Supervision of high risk pregnancy, unspecified, third trimester: Secondary | ICD-10-CM | POA: Insufficient documentation

## 2020-04-29 DIAGNOSIS — K831 Obstruction of bile duct: Secondary | ICD-10-CM | POA: Diagnosis not present

## 2020-04-29 DIAGNOSIS — O26612 Liver and biliary tract disorders in pregnancy, second trimester: Secondary | ICD-10-CM

## 2020-04-29 DIAGNOSIS — Z3A34 34 weeks gestation of pregnancy: Secondary | ICD-10-CM | POA: Diagnosis not present

## 2020-04-29 DIAGNOSIS — O26642 Intrahepatic cholestasis of pregnancy, second trimester: Secondary | ICD-10-CM

## 2020-05-01 LAB — BILE ACIDS, TOTAL: Bile Acids Total: 12.4 umol/L — ABNORMAL HIGH (ref 0.0–10.0)

## 2020-05-04 ENCOUNTER — Other Ambulatory Visit: Payer: Self-pay

## 2020-05-04 ENCOUNTER — Encounter: Payer: Self-pay | Admitting: Obstetrics and Gynecology

## 2020-05-04 ENCOUNTER — Ambulatory Visit (INDEPENDENT_AMBULATORY_CARE_PROVIDER_SITE_OTHER): Payer: Medicaid Other | Admitting: Obstetrics and Gynecology

## 2020-05-04 VITALS — BP 118/74 | Wt 162.0 lb

## 2020-05-04 DIAGNOSIS — Z3A35 35 weeks gestation of pregnancy: Secondary | ICD-10-CM

## 2020-05-04 DIAGNOSIS — R8271 Bacteriuria: Secondary | ICD-10-CM

## 2020-05-04 DIAGNOSIS — O26613 Liver and biliary tract disorders in pregnancy, third trimester: Secondary | ICD-10-CM | POA: Diagnosis not present

## 2020-05-04 DIAGNOSIS — O4402 Placenta previa specified as without hemorrhage, second trimester: Secondary | ICD-10-CM

## 2020-05-04 DIAGNOSIS — O0993 Supervision of high risk pregnancy, unspecified, third trimester: Secondary | ICD-10-CM

## 2020-05-04 DIAGNOSIS — O210 Mild hyperemesis gravidarum: Secondary | ICD-10-CM

## 2020-05-04 DIAGNOSIS — K831 Obstruction of bile duct: Secondary | ICD-10-CM

## 2020-05-04 DIAGNOSIS — O99891 Other specified diseases and conditions complicating pregnancy: Secondary | ICD-10-CM

## 2020-05-04 NOTE — Patient Instructions (Addendum)
Induction Information: Your induction date: 05/16/2020 at 0800AM Covid test: 05/14/2020 between 9-10AM. Go to the [facility] drive-through.  Wear a mask and stay in your car.   This should not take long.  On your induction date, go to the ER a little before your induction time and let them know you're there for your labor induction.

## 2020-05-04 NOTE — Progress Notes (Signed)
Routine Prenatal Care Visit  Subjective  Adriana Kerr is a 20 y.o. G3P0020 at [redacted]w[redacted]d being seen today for ongoing prenatal care.  She is currently monitored for the following issues for this high-risk pregnancy and has Encounter for supervision of high risk pregnancy in third trimester, antepartum; Hyperemesis affecting pregnancy, antepartum; Migraine without status migrainosus, not intractable; Patellar instability of left knee; Patellar instability of right knee; Patellofemoral dysfunction of right knee; Radioulnar synostosis of left upper extremity; Placenta previa antepartum in second trimester; Itching; Intrahepatic cholestasis of pregnancy in second trimester; Abdominal pain during pregnancy in third trimester; [redacted] weeks gestation of pregnancy; Bacteriuria during pregnancy in third trimester; Vomiting; Hematemesis; and [redacted] weeks gestation of pregnancy on their problem list.  ----------------------------------------------------------------------------------- Patient reports no complaints.  Denies itching - only very mild sx. Contractions: Not present. Vag. Bleeding: None.  Movement: Present. Leaking Fluid denies.  ----------------------------------------------------------------------------------- The following portions of the patient's history were reviewed and updated as appropriate: allergies, current medications, past family history, past medical history, past social history, past surgical history and problem list. Problem list updated.  Objective  Blood pressure 118/74, weight 162 lb (73.5 kg), last menstrual period 08/26/2019. Pregravid weight 171 lb (77.6 kg) Total Weight Gain -9 lb (-4.082 kg) Urinalysis: Urine Protein    Urine Glucose    Fetal Status: Fetal Heart Rate (bpm): 140   Movement: Present     General:  Alert, oriented and cooperative. Patient is in no acute distress.  Skin: Skin is warm and dry. No rash noted.   Cardiovascular: Normal heart rate noted  Respiratory:  Normal respiratory effort, no problems with respiration noted  Abdomen: Soft, gravid, appropriate for gestational age. Pain/Pressure: Absent     Pelvic:  Cervical exam deferred        Extremities: Normal range of motion.  Edema: None  Mental Status: Normal mood and affect. Normal behavior. Normal judgment and thought content.   NST: Baseline FHR: 140 beats/min Variability: moderate Accelerations: present Decelerations: absent Tocometry: not done  Interpretation:  INDICATIONS: cholestasis of pregnancy RESULTS:  A NST procedure was performed with FHR monitoring and a normal baseline established, appropriate time of 20-40 minutes of evaluation, and accels >2 seen w 15x15 characteristics.  Results show a REACTIVE NST.    Assessment   19 y.o. [id number] at [redacted]w[redacted]d by  06/06/2020, by Ultrasound presenting for routine prenatal visit  Plan   Pregnancy1 Problems (from 01/29/20 to present)    Problem Noted Resolved   Hematemesis 04/20/2020 by Conard Novak, MD No   Hyperemesis affecting pregnancy, antepartum 02/24/2020 by Natale Milch, MD No   Placenta previa antepartum in second trimester 02/24/2020 by Zipporah Plants, CNM No   Encounter for supervision of high risk pregnancy in third trimester, antepartum 01/29/2020 by Christeen Douglas, MD No   Overview Addendum 03/16/2020  1:59 PM by Zipporah Plants, CNM    [facility]inic [facility] (NOB labs at [facility] on 11/13/19)  Dating  7w [place] Blood type: --/--/A POS (01/04 1907)   Genetic Screen  NIPS: negx3, xx Antibody:NEG (01/04 1907)  Anatomic US  anatomy wnl - marginal placental previa - needs repeat at 28 weeks Rubella:   immune  Varicella: immune  GTT  Third trimester: desires glucose monitoring as alternative option RPR:   non-reactive  Rhogam  n/a HBsAg:   non-reactive  TDaP vaccine                       Flu Shot:  declines HIV:   negative  Baby Food  breast              GBS:   Contraception  BTL if cesarean Pap: n/a (<21 yo)  CBB     CS/VBAC     Support Person  Ryan     Pregnancy Diagnoses  ICP (bile acids 28 on 02/28/20)  Per Dr. Grace Bushy (MFM) - weekly NST/AFI until 32 weeks - then weekly BPP/NST        Previous Version       Preterm labor symptoms and general obstetric precautions including but not limited to vaginal bleeding, contractions, leaking of fluid and fetal movement were reviewed in detail with the patient. Please refer to After Visit Summary for other counseling recommendations.   - GBS/aptima next visit (she declined this visit) - IOL 3/27 at 0800  Return in about 1 week (around 05/11/2020) for U/S for AFI/Routine Prenatal Appointment with NST.   Thomasene Mohair, MD, [facility], [facility] 05/04/2020 3:01 PM

## 2020-05-06 ENCOUNTER — Other Ambulatory Visit: Payer: Medicaid Other

## 2020-05-06 ENCOUNTER — Other Ambulatory Visit: Payer: Self-pay | Admitting: Obstetrics & Gynecology

## 2020-05-06 ENCOUNTER — Other Ambulatory Visit: Payer: Self-pay

## 2020-05-06 DIAGNOSIS — O26613 Liver and biliary tract disorders in pregnancy, third trimester: Secondary | ICD-10-CM

## 2020-05-06 DIAGNOSIS — K831 Obstruction of bile duct: Secondary | ICD-10-CM

## 2020-05-07 NOTE — Progress Notes (Signed)
She's set up for 37 weeks

## 2020-05-08 ENCOUNTER — Other Ambulatory Visit: Payer: Self-pay | Admitting: Obstetrics and Gynecology

## 2020-05-08 DIAGNOSIS — K831 Obstruction of bile duct: Secondary | ICD-10-CM

## 2020-05-08 DIAGNOSIS — O26643 Intrahepatic cholestasis of pregnancy, third trimester: Secondary | ICD-10-CM

## 2020-05-08 DIAGNOSIS — Z3A35 35 weeks gestation of pregnancy: Secondary | ICD-10-CM

## 2020-05-08 DIAGNOSIS — O0993 Supervision of high risk pregnancy, unspecified, third trimester: Secondary | ICD-10-CM

## 2020-05-08 LAB — BILE ACIDS, TOTAL: Bile Acids Total: 2.7 umol/L (ref 0.0–10.0)

## 2020-05-11 ENCOUNTER — Other Ambulatory Visit: Payer: Self-pay

## 2020-05-11 ENCOUNTER — Ambulatory Visit (INDEPENDENT_AMBULATORY_CARE_PROVIDER_SITE_OTHER): Payer: Medicaid Other | Admitting: Obstetrics

## 2020-05-11 VITALS — BP 120/82 | Wt 160.0 lb

## 2020-05-11 DIAGNOSIS — Z3A36 36 weeks gestation of pregnancy: Secondary | ICD-10-CM

## 2020-05-11 DIAGNOSIS — O26643 Intrahepatic cholestasis of pregnancy, third trimester: Secondary | ICD-10-CM

## 2020-05-11 DIAGNOSIS — Z113 Encounter for screening for infections with a predominantly sexual mode of transmission: Secondary | ICD-10-CM

## 2020-05-11 DIAGNOSIS — K831 Obstruction of bile duct: Secondary | ICD-10-CM

## 2020-05-11 DIAGNOSIS — O26613 Liver and biliary tract disorders in pregnancy, third trimester: Secondary | ICD-10-CM

## 2020-05-11 DIAGNOSIS — O0993 Supervision of high risk pregnancy, unspecified, third trimester: Secondary | ICD-10-CM

## 2020-05-11 LAB — FETAL NONSTRESS TEST

## 2020-05-11 LAB — POCT URINALYSIS DIPSTICK OB
Glucose, UA: NEGATIVE
POC,PROTEIN,UA: NEGATIVE

## 2020-05-11 NOTE — Progress Notes (Signed)
Routine Prenatal Care Visit  Subjective  Adriana Kerr is a 20 y.o. G3P0020 at [redacted]w[redacted]d being seen today for ongoing prenatal care.  She is currently monitored for the following issues for this high-risk pregnancy and has Encounter for supervision of high risk pregnancy in third trimester, antepartum; Hyperemesis affecting pregnancy, antepartum; Migraine without status migrainosus, not intractable; Patellar instability of left knee; Patellar instability of right knee; Patellofemoral dysfunction of right knee; Radioulnar synostosis of left upper extremity; Placenta previa antepartum in second trimester; Itching; Intrahepatic cholestasis of pregnancy in second trimester; Abdominal pain during pregnancy in third trimester; [redacted] weeks gestation of pregnancy; Bacteriuria during pregnancy in third trimester; Vomiting; Hematemesis; and [redacted] weeks gestation of pregnancy on their problem list.  ----------------------------------------------------------------------------------- Patient reports no bleeding, no contractions, no cramping, no leaking and and regarding her IOL this next sunday has a number of desires for her experience..She is declining a vaginal exam today, and will prefer limited exams during her IOL. Her husband is with her for today's visit..   Contractions: Not present. Vag. Bleeding: None.  Movement: Present. Leaking Fluid denies.  ----------------------------------------------------------------------------------- The following portions of the patient's history were reviewed and updated as appropriate: allergies, current medications, past family history, past medical history, past social history, past surgical history and problem list. Problem list updated.  Objective  Blood pressure 120/82, weight 160 lb (72.6 kg), last menstrual period 08/26/2019. Pregravid weight 171 lb (77.6 kg) Total Weight Gain -11 lb (-4.99 kg) Urinalysis: Urine Protein Negative  Urine Glucose Negative  Fetal Status:  Fetal Heart Rate (bpm): RNST   Movement: Present     General:  Alert, oriented and cooperative. Patient is in no acute distress.  Skin: Skin is warm and dry. No rash noted.   Cardiovascular: Normal heart rate noted  Respiratory: Normal respiratory effort, no problems with respiration noted  Abdomen: Soft, gravid, appropriate for gestational age. Pain/Pressure: Absent     Pelvic:  Cervical exam deferred        Extremities: Normal range of motion.  Edema: None  Mental Status: Normal mood and affect. Normal behavior. Normal judgment and thought content.   Assessment   19 y.o. H7S1423 at [redacted]w[redacted]d by  06/06/2020, by Ultrasound presenting for routine prenatal visit  Plan   Pregnancy1 Problems (from 01/29/20 to present)    Problem Noted Resolved   Hematemesis 04/20/2020 by Conard Novak, MD No   Hyperemesis affecting pregnancy, antepartum 02/24/2020 by Natale Milch, MD No   Placenta previa antepartum in second trimester 02/24/2020 by Zipporah Plants, CNM No   Encounter for supervision of high risk pregnancy in third trimester, antepartum 01/29/2020 by Christeen Douglas, MD No   Overview Addendum 03/16/2020  1:59 PM by Zipporah Plants, CNM    [facility]inic [facility] (NOB labs at [facility] on 11/13/19)  Dating  7w [place] Blood type: --/--/A POS (01/04 1907)   Genetic Screen  NIPS: negx3, xx Antibody:NEG (01/04 1907)  Anatomic US  anatomy wnl - marginal placental previa - needs repeat at 28 weeks Rubella:   immune  Varicella: immune  GTT  Third trimester: desires glucose monitoring as alternative option RPR:   non-reactive  Rhogam  n/a HBsAg:   non-reactive  TDaP vaccine                       Flu Shot: declines HIV:   negative  Baby Food  breast  GBS:   Contraception  BTL if cesarean Pap: n/a (<21 yo)  CBB     CS/VBAC    Support Person  Ryan     Pregnancy Diagnoses  ICP (bile acids 28 on 02/28/20)  Per Dr. Grace Bushy (MFM) - weekly NST/AFI until 32 weeks - then weekly BPP/NST         Previous Version       Term labor symptoms and general obstetric precautions including but not limited to vaginal bleeding, contractions, leaking of fluid and fetal movement were reviewed in detail with the patient. Please refer to After Visit Summary for other counseling recommendations.  She has another BPP later this week with the MFMs. Plan for IOL this Sunday, March 27th at 0800. She is aware that she needs to get COVID tested. Additional desires : does not want an epidural "unless she caves", limit vaginal exams, and she is opposed to a foley ball. She also shares that she ahs a bed back and so an epidural is out. She also describes a condition where she begins to shake spontaneously and has tremors that have not been diagnosed- shares concerns that this might occur during her labor. She will see MFM later this week and then has her IOL on Sunday.    Mirna Mires, CNM  05/11/2020 4:57 PM

## 2020-05-13 ENCOUNTER — Ambulatory Visit: Payer: Medicaid Other | Attending: Maternal & Fetal Medicine

## 2020-05-13 ENCOUNTER — Other Ambulatory Visit: Payer: Self-pay

## 2020-05-13 DIAGNOSIS — Z3A35 35 weeks gestation of pregnancy: Secondary | ICD-10-CM

## 2020-05-13 DIAGNOSIS — O4402 Placenta previa specified as without hemorrhage, second trimester: Secondary | ICD-10-CM

## 2020-05-13 DIAGNOSIS — K831 Obstruction of bile duct: Secondary | ICD-10-CM | POA: Diagnosis not present

## 2020-05-13 DIAGNOSIS — Z3A36 36 weeks gestation of pregnancy: Secondary | ICD-10-CM

## 2020-05-13 DIAGNOSIS — O26613 Liver and biliary tract disorders in pregnancy, third trimester: Secondary | ICD-10-CM | POA: Diagnosis not present

## 2020-05-13 DIAGNOSIS — O0993 Supervision of high risk pregnancy, unspecified, third trimester: Secondary | ICD-10-CM | POA: Insufficient documentation

## 2020-05-13 DIAGNOSIS — O210 Mild hyperemesis gravidarum: Secondary | ICD-10-CM

## 2020-05-13 LAB — GC/CHLAMYDIA PROBE AMP
Chlamydia trachomatis, NAA: NEGATIVE
Neisseria Gonorrhoeae by PCR: NEGATIVE

## 2020-05-14 ENCOUNTER — Other Ambulatory Visit
Admission: RE | Admit: 2020-05-14 | Discharge: 2020-05-14 | Disposition: A | Discharge status: Home / Self Care | Payer: Medicaid Other | Source: Ambulatory Visit | Attending: Obstetrics and Gynecology | Admitting: Obstetrics and Gynecology

## 2020-05-14 DIAGNOSIS — Z01812 Encounter for preprocedural laboratory examination: Secondary | ICD-10-CM | POA: Insufficient documentation

## 2020-05-14 DIAGNOSIS — Z20822 Contact with and (suspected) exposure to covid-19: Secondary | ICD-10-CM | POA: Insufficient documentation

## 2020-05-14 LAB — SARS CORONAVIRUS 2 (TAT 6-24 HRS): SARS Coronavirus 2: NEGATIVE

## 2020-05-15 LAB — STREP GP B CULTURE+RFLX: Strep Gp B Culture+Rflx: NEGATIVE

## 2020-05-16 ENCOUNTER — Other Ambulatory Visit: Payer: Self-pay

## 2020-05-16 ENCOUNTER — Encounter: Payer: Self-pay | Admitting: Obstetrics and Gynecology

## 2020-05-16 ENCOUNTER — Inpatient Hospital Stay
Admission: EM | Admit: 2020-05-16 | Discharge: 2020-05-18 | DRG: 805 | Disposition: A | Discharge status: Home / Self Care | Payer: Medicaid Other | Attending: Obstetrics and Gynecology | Admitting: Obstetrics and Gynecology

## 2020-05-16 DIAGNOSIS — O0993 Supervision of high risk pregnancy, unspecified, third trimester: Secondary | ICD-10-CM

## 2020-05-16 DIAGNOSIS — O26613 Liver and biliary tract disorders in pregnancy, third trimester: Secondary | ICD-10-CM | POA: Diagnosis not present

## 2020-05-16 DIAGNOSIS — O99324 Drug use complicating childbirth: Secondary | ICD-10-CM | POA: Diagnosis present

## 2020-05-16 DIAGNOSIS — K920 Hematemesis: Principal | ICD-10-CM

## 2020-05-16 DIAGNOSIS — O26643 Intrahepatic cholestasis of pregnancy, third trimester: Secondary | ICD-10-CM | POA: Diagnosis present

## 2020-05-16 DIAGNOSIS — Z3A37 37 weeks gestation of pregnancy: Secondary | ICD-10-CM | POA: Diagnosis not present

## 2020-05-16 DIAGNOSIS — O210 Mild hyperemesis gravidarum: Secondary | ICD-10-CM

## 2020-05-16 DIAGNOSIS — O2662 Liver and biliary tract disorders in childbirth: Principal | ICD-10-CM | POA: Diagnosis present

## 2020-05-16 DIAGNOSIS — O43123 Velamentous insertion of umbilical cord, third trimester: Secondary | ICD-10-CM | POA: Diagnosis present

## 2020-05-16 DIAGNOSIS — F1290 Cannabis use, unspecified, uncomplicated: Secondary | ICD-10-CM | POA: Diagnosis present

## 2020-05-16 DIAGNOSIS — K831 Obstruction of bile duct: Secondary | ICD-10-CM | POA: Diagnosis present

## 2020-05-16 DIAGNOSIS — O4402 Placenta previa specified as without hemorrhage, second trimester: Secondary | ICD-10-CM

## 2020-05-16 DIAGNOSIS — O43193 Other malformation of placenta, third trimester: Secondary | ICD-10-CM | POA: Diagnosis not present

## 2020-05-16 LAB — URINE DRUG SCREEN, QUALITATIVE (ARMC ONLY)
Amphetamines, Ur Screen: NOT DETECTED
Barbiturates, Ur Screen: NOT DETECTED
Benzodiazepine, Ur Scrn: NOT DETECTED
Cannabinoid 50 Ng, Ur ~~LOC~~: POSITIVE — AB
Cocaine Metabolite,Ur ~~LOC~~: NOT DETECTED
MDMA (Ecstasy)Ur Screen: NOT DETECTED
Methadone Scn, Ur: NOT DETECTED
Opiate, Ur Screen: NOT DETECTED
Phencyclidine (PCP) Ur S: NOT DETECTED
Tricyclic, Ur Screen: NOT DETECTED

## 2020-05-16 LAB — CBC
HCT: 31.4 % — ABNORMAL LOW (ref 36.0–46.0)
Hemoglobin: 10.1 g/dL — ABNORMAL LOW (ref 12.0–15.0)
MCH: 27.1 pg (ref 26.0–34.0)
MCHC: 32.2 g/dL (ref 30.0–36.0)
MCV: 84.2 fL (ref 80.0–100.0)
Platelets: 166 K/uL (ref 150–400)
RBC: 3.73 MIL/uL — ABNORMAL LOW (ref 3.87–5.11)
RDW: 13.2 % (ref 11.5–15.5)
WBC: 5.8 K/uL (ref 4.0–10.5)
nRBC: 0 % (ref 0.0–0.2)

## 2020-05-16 LAB — TYPE AND SCREEN
ABO/RH(D): A POS
Antibody Screen: NEGATIVE

## 2020-05-16 MED ORDER — AMMONIA AROMATIC IN INHA
RESPIRATORY_TRACT | Status: AC
  Filled 2020-05-16: qty 10

## 2020-05-16 MED ORDER — TERBUTALINE SULFATE 1 MG/ML IJ SOLN: 0.2500 mg | Freq: Once | INTRAMUSCULAR | Status: DC | PRN

## 2020-05-16 MED ORDER — OXYTOCIN-SODIUM CHLORIDE 30-0.9 UT/500ML-% IV SOLN
1.0000 m[IU]/min | INTRAVENOUS | Status: DC
  Administered 2020-05-16: 2 m[IU]/min via INTRAVENOUS
  Administered 2020-05-17: 7 m[IU]/min via INTRAVENOUS
  Filled 2020-05-16: qty 500

## 2020-05-16 MED ORDER — OXYTOCIN 10 UNIT/ML IJ SOLN
INTRAMUSCULAR | Status: AC
  Filled 2020-05-16: qty 2

## 2020-05-16 MED ORDER — LACTATED RINGERS IV SOLN: INTRAVENOUS | Status: DC

## 2020-05-16 MED ORDER — LIDOCAINE HCL (PF) 1 % IJ SOLN
30.0000 mL | INTRAMUSCULAR | Status: DC | PRN
  Filled 2020-05-16: qty 30

## 2020-05-16 MED ORDER — MISOPROSTOL 200 MCG PO TABS
ORAL_TABLET | ORAL | Status: AC
  Filled 2020-05-16: qty 4

## 2020-05-16 MED ORDER — BUTORPHANOL TARTRATE 1 MG/ML IJ SOLN: 1.0000 mg | INTRAMUSCULAR | Status: DC | PRN

## 2020-05-16 MED ORDER — OXYTOCIN BOLUS FROM INFUSION
333.0000 mL | Freq: Once | INTRAVENOUS | Status: AC
  Administered 2020-05-17: 333 mL via INTRAVENOUS

## 2020-05-16 MED ORDER — ONDANSETRON HCL 4 MG/2ML IJ SOLN
4.0000 mg | Freq: Four times a day (QID) | INTRAMUSCULAR | Status: DC | PRN
  Administered 2020-05-16 – 2020-05-17 (×4): 4 mg via INTRAVENOUS
  Filled 2020-05-16 (×4): qty 2

## 2020-05-16 MED ORDER — MISOPROSTOL 25 MCG QUARTER TABLET
25.0000 ug | ORAL_TABLET | ORAL | Status: DC | PRN
Start: 2020-05-16 — End: 2020-05-17
  Administered 2020-05-16 (×2): 25 ug via VAGINAL
  Filled 2020-05-16 (×3): qty 1

## 2020-05-16 MED ORDER — LACTATED RINGERS IV SOLN
500.0000 mL | INTRAVENOUS | Status: DC | PRN
Start: 2020-05-16 — End: 2020-05-17

## 2020-05-16 MED ORDER — SOD CITRATE-CITRIC ACID 500-334 MG/5ML PO SOLN
30.0000 mL | ORAL | Status: DC | PRN
Start: 2020-05-16 — End: 2020-05-17

## 2020-05-16 MED ORDER — OXYTOCIN-SODIUM CHLORIDE 30-0.9 UT/500ML-% IV SOLN
2.5000 [IU]/h | INTRAVENOUS | Status: DC
  Administered 2020-05-17 (×2): 2.5 [IU]/h via INTRAVENOUS
  Filled 2020-05-16: qty 500

## 2020-05-16 NOTE — Progress Notes (Signed)
Adriana Kerr is a 20 y.o. G3P0020 at [redacted]w[redacted]d by ultrasound admitted for induction of labor due to cholestasis of pregnancy.  Subjective: she has rested, but not slept. Feeling cramping, but not complaining thatcontractions they are painful.  Objective: BP (!) 126/96 (BP Location: Left Arm)   Pulse 64   Temp 98.4 F (36.9 C) (Oral)   Resp 16   Ht 5\' 3"  (1.6 m)   Wt 72.1 kg   LMP 08/26/2019   BMI 28.17 kg/m  No intake/output data recorded. Total I/O In: 646 [I.V.:646] Out: -   FHT:  FHR: baseline bpm, variability: moderate,  accelerations:  Present,  decelerations:  Absent UC:   regular, every 2-3 minutes, mild to touch SVE:   Dilation: 2 Effacement (%): 70 Station: -3 Exam by:: M.Jenilyn Magana,CNM  Bishop score:4  Labs: Lab Results  Component Value Date   WBC 5.8 05/16/2020   HGB 10.1 (L) 05/16/2020   HCT 31.4 (L) 05/16/2020   MCV 84.2 05/16/2020   PLT 166 05/16/2020    Assessment / Plan: Induction of labor due to cholestasis of pregnancy,   For ongoing cervical ripening and then likely pitocin.  Labor: Progressing normally  Fetal Wellbeing:  Category I Pain Control:  Labor support without medications I/D:  n/a Anticipated MOD:  NSVD  Cytotec 05/18/2020 placed vaginally by the cervix. Will recheck in 4 hours or PRN. Anticipate augmentation with pitocin once Bishop score imporves.  05/16/2020, 2:26 PM

## 2020-05-16 NOTE — H&P (Addendum)
Adriana Kerr is a 20 y.o. female presenting for induction of labor secondary to cholestasis. She has been consented for her induction and is accompanied by her husband. Her baby has been moving well and she denies feeling contractions. OB History    Gravida  3   Para      Term      Preterm      AB  2   Living        SAB      IAB      Ectopic      Multiple      Live Births             Past Medical History:  Diagnosis Date  . Anxiety   . Depression   . Migraines   . PONV (postoperative nausea and vomiting)    Past Surgical History:  Procedure Laterality Date  . ADENOIDECTOMY    . ELBOW SURGERY    . KNEE SURGERY    . TONSILLECTOMY     Family History: family history includes Alcohol abuse in her father; Arthritis in her maternal aunt; Breast cancer (age of onset: 33) in her paternal grandmother; Crohn's disease in her maternal aunt; Diabetes type II in her paternal grandfather; Drug abuse in her mother; Fibromyalgia in her mother; Migraines in her maternal grandmother, mother, and sister; Ovarian cancer in her maternal grandmother; Stroke in her maternal aunt; Thyroid disease in her father; Turner syndrome in her maternal aunt; Ulcers in her mother. Social History:  reports that she is a non-smoker but has been exposed to tobacco smoke. She has never used smokeless tobacco. She reports current drug use. Drug: Marijuana. She reports that she does not drink alcohol.     Maternal Diabetes: No Genetic Screening: Normal Maternal Ultrasounds/Referrals: Other: Fetal Ultrasounds or other Referrals:  Referred to Materal Fetal Medicine  for cholestasis Maternal Substance Abuse:  Yes:  Type: Marijuana Significant Maternal Medications:  Meds include: Other: Actigall Significant Maternal Lab Results:  Group B Strep negative Other Comments:  None  Review of Systems  Constitutional: Negative.   HENT: Negative.   Eyes: Negative.   Respiratory: Negative.    Cardiovascular: Negative.   Gastrointestinal: Negative.   Endocrine: Negative.   Musculoskeletal: Negative.   Allergic/Immunologic: Negative.   Neurological: Negative.   Hematological: Negative.   Psychiatric/Behavioral: Negative.    History Dilation: 1.5 Effacement (%): 50 Station: Ballotable Exam by:: M.Datha Kissinger, CNM   Bishop score:3 Blood pressure 126/88, pulse 94, temperature 98.6 F (37 C), temperature source Oral, resp. rate 16, height 5\' 3"  (1.6 m), weight 72.1 kg, last menstrual period 08/26/2019. Maternal Exam:  Uterine Assessment: Contraction strength is mild.  Contraction frequency is rare.   Abdomen: Patient reports no abdominal tenderness. Estimated fetal weight is <7 lbs.   Fetal presentation: vertex  Introitus: Normal vulva. Normal vagina.  Ferning test: not done.  Nitrazine test: not done.  Pelvis: adequate for delivery.   Cervix: Cervix evaluated by digital exam.     Physical Exam Constitutional:      Appearance: Normal appearance.  HENT:     Head: Normocephalic and atraumatic.     Nose: Nose normal.  Cardiovascular:     Rate and Rhythm: Normal rate and regular rhythm.     Pulses: Normal pulses.     Heart sounds: Normal heart sounds.  Pulmonary:     Effort: Pulmonary effort is normal.     Breath sounds: Normal breath sounds.  Abdominal:  Palpations: Abdomen is soft.  Genitourinary:    General: Normal vulva.     Comments: SVE: 1.5cms/50%/ballotable Musculoskeletal:        General: Normal range of motion.     Cervical back: Normal range of motion and neck supple.  Skin:    General: Skin is warm and dry.  Neurological:     General: No focal deficit present.     Mental Status: She is alert and oriented to person, place, and time.  Psychiatric:        Mood and Affect: Mood normal.        Behavior: Behavior normal.     Prenatal labs: ABO, Rh: --/--/A POS (03/27 4098) Antibody: NEG (03/27 0844) Rubella: 5.50 (01/19 1516) RPR: Non Reactive  (01/19 1516)  HBsAg: Negative (01/19 1516)  HIV: Non Reactive (01/19 1516)  GBS: Negative/-- (03/22 1546)   Assessment/Plan: IUP [redacted] weeks gestation with cholestasis of pregnancy for IOL Reactive FHTs Routine admission orders Routine admission labs + bile acids and urine drug screen ( MJ user) Cytotec to start for cervical ripening. Anticipate pitocin  And AROM once more favorable   Mirna Mires, CNM  05/16/2020 10:11 AM   Dr. Bonney Aid notified of patient's admission and the POC.  Recheck in 4-6 hours.  Mirna Mires 05/16/2020, 9:48 AM

## 2020-05-16 NOTE — Progress Notes (Signed)
Adriana Kerr is a 20 y.o. G3P0020 at [redacted]w[redacted]d by ultrasound admitted for induction of labor due to cholestasis.  Subjective: she has walked and been on the labor ball. She is aware of her contractions but is not considering them painful.   Objective: BP 128/68 (BP Location: Left Arm)   Pulse (!) 58   Temp 98.3 F (36.8 C) (Oral)   Resp 18   Ht 5\' 3"  (1.6 m)   Wt 72.1 kg   LMP 08/26/2019   BMI 28.17 kg/m  I/O last 3 completed shifts: In: 1226.5 [I.V.:1226.5] Out: -  No intake/output data recorded.  FHT:  FHR: 135 bpm, variability: moderate,  accelerations:  Present,  decelerations:  Absent UC:   regular, every 2 minutes SVE:   Dilation: 3 Effacement (%): 80 Station: -2 Exam by:: M.Collen Hostler,CNM  Bishop score 6-7  Labs: Lab Results  Component Value Date   WBC 5.8 05/16/2020   HGB 10.1 (L) 05/16/2020   HCT 31.4 (L) 05/16/2020   MCV 84.2 05/16/2020   PLT 166 05/16/2020    Assessment / Plan: Induction of labor due to cholestasis of pregnancy. Cervical ripening Improved Bishop score. Will start pitocin per protocol in several hours once her ucs are less frequent  Labor: Progressing normally  Fetal Wellbeing:  Category I Pain Control:  Labor support without medications I/D:  n/a Anticipated MOD:  NSVD   Discussed option of AROM now vs initiating pitocin. Her preference is too work toward an unmedicated labor and birth. Agreed to start pitocin in several hours. Will AROM once more dilated.  05/18/2020 05/16/2020, 7:24 PM

## 2020-05-16 NOTE — Progress Notes (Signed)
Pt presents to L&D for scheduled IOL. Pt denies leaking of fluid, vaginal bleeding, and contractions. Pt state positive fetal movement

## 2020-05-17 ENCOUNTER — Inpatient Hospital Stay: Payer: Medicaid Other | Admitting: Anesthesiology

## 2020-05-17 ENCOUNTER — Encounter: Payer: Self-pay | Admitting: Obstetrics

## 2020-05-17 DIAGNOSIS — O26613 Liver and biliary tract disorders in pregnancy, third trimester: Secondary | ICD-10-CM

## 2020-05-17 DIAGNOSIS — K831 Obstruction of bile duct: Secondary | ICD-10-CM | POA: Diagnosis not present

## 2020-05-17 DIAGNOSIS — Z3A37 37 weeks gestation of pregnancy: Secondary | ICD-10-CM

## 2020-05-17 DIAGNOSIS — O43193 Other malformation of placenta, third trimester: Secondary | ICD-10-CM | POA: Diagnosis not present

## 2020-05-17 LAB — COMPREHENSIVE METABOLIC PANEL
ALT: 12 U/L (ref 0–44)
AST: 37 U/L (ref 15–41)
Albumin: 2.6 g/dL — ABNORMAL LOW (ref 3.5–5.0)
Alkaline Phosphatase: 163 U/L — ABNORMAL HIGH (ref 38–126)
Anion gap: 11 (ref 5–15)
BUN: 9 mg/dL (ref 6–20)
CO2: 18 mmol/L — ABNORMAL LOW (ref 22–32)
Calcium: 8.7 mg/dL — ABNORMAL LOW (ref 8.9–10.3)
Chloride: 106 mmol/L (ref 98–111)
Creatinine, Ser: 0.66 mg/dL (ref 0.44–1.00)
GFR, Estimated: >60 mL/min (ref >60)
Glucose, Bld: 95 mg/dL (ref 70–99)
Potassium: 3.6 mmol/L (ref 3.5–5.1)
Sodium: 135 mmol/L (ref 135–145)
Total Bilirubin: 1.2 mg/dL (ref 0.3–1.2)
Total Protein: 5.6 g/dL — ABNORMAL LOW (ref 6.5–8.1)

## 2020-05-17 LAB — PROTEIN / CREATININE RATIO, URINE
Creatinine, Urine: 310 mg/dL
Protein Creatinine Ratio: 0.52 mg/mg{Cre} — ABNORMAL HIGH (ref 0.00–0.15)
Total Protein, Urine: 160 mg/dL

## 2020-05-17 LAB — RPR: RPR Ser Ql: NONREACTIVE

## 2020-05-17 MED ORDER — COCONUT OIL OIL: 1.0000 "application " | TOPICAL_OIL | Status: DC | PRN

## 2020-05-17 MED ORDER — PRENATAL MULTIVITAMIN CH
1.0000 | ORAL_TABLET | Freq: Every day | ORAL | Status: DC
  Filled 2020-05-17: qty 1

## 2020-05-17 MED ORDER — SODIUM CHLORIDE 0.9 % IV SOLN
INTRAVENOUS | Status: DC | PRN
  Administered 2020-05-17 (×2): 5 mL via EPIDURAL

## 2020-05-17 MED ORDER — ONDANSETRON HCL 4 MG/2ML IJ SOLN: 4.0000 mg | INTRAMUSCULAR | Status: DC | PRN

## 2020-05-17 MED ORDER — ACETAMINOPHEN 325 MG PO TABS
650.0000 mg | ORAL_TABLET | ORAL | Status: DC | PRN
  Administered 2020-05-18: 650 mg via ORAL
  Filled 2020-05-17: qty 2

## 2020-05-17 MED ORDER — SENNOSIDES-DOCUSATE SODIUM 8.6-50 MG PO TABS
2.0000 | ORAL_TABLET | Freq: Every day | ORAL | Status: DC
  Administered 2020-05-18: 2 via ORAL
  Filled 2020-05-17: qty 2

## 2020-05-17 MED ORDER — DIBUCAINE (PERIANAL) 1 % EX OINT
1.0000 "application " | TOPICAL_OINTMENT | CUTANEOUS | Status: DC | PRN
  Administered 2020-05-17: 1 via RECTAL
  Filled 2020-05-17: qty 28

## 2020-05-17 MED ORDER — FENTANYL 2.5 MCG/ML W/ROPIVACAINE 0.15% IN NS 100 ML EPIDURAL (ARMC)
EPIDURAL | Status: AC
  Filled 2020-05-17: qty 100

## 2020-05-17 MED ORDER — LIDOCAINE-EPINEPHRINE (PF) 1.5 %-1:200000 IJ SOLN
INTRAMUSCULAR | Status: DC | PRN
  Administered 2020-05-17: 3 mL via EPIDURAL

## 2020-05-17 MED ORDER — BENZOCAINE-MENTHOL 20-0.5 % EX AERO
1.0000 "application " | INHALATION_SPRAY | CUTANEOUS | Status: DC | PRN
  Administered 2020-05-17: 1 via TOPICAL
  Filled 2020-05-17: qty 56

## 2020-05-17 MED ORDER — SIMETHICONE 80 MG PO CHEW: 80.0000 mg | CHEWABLE_TABLET | ORAL | Status: DC | PRN

## 2020-05-17 MED ORDER — LIDOCAINE HCL (PF) 1 % IJ SOLN
INTRAMUSCULAR | Status: DC | PRN
  Administered 2020-05-17 (×2): 1 mL via SUBCUTANEOUS

## 2020-05-17 MED ORDER — THIAMINE HCL 100 MG/ML IJ SOLN
Freq: Once | INTRAVENOUS | Status: DC
  Filled 2020-05-17: qty 1000

## 2020-05-17 MED ORDER — FAMOTIDINE IN NACL 20-0.9 MG/50ML-% IV SOLN
20.0000 mg | Freq: Once | INTRAVENOUS | Status: DC
  Filled 2020-05-17: qty 50

## 2020-05-17 MED ORDER — LACTATED RINGERS IV BOLUS: 1000.0000 mL | Freq: Once | INTRAVENOUS | Status: DC

## 2020-05-17 MED ORDER — ONDANSETRON HCL 4 MG PO TABS
4.0000 mg | ORAL_TABLET | ORAL | Status: DC | PRN
Start: 2020-05-17 — End: 2020-05-18

## 2020-05-17 MED ORDER — IBUPROFEN 600 MG PO TABS
600.0000 mg | ORAL_TABLET | Freq: Four times a day (QID) | ORAL | Status: DC
  Administered 2020-05-17 – 2020-05-18 (×4): 600 mg via ORAL
  Filled 2020-05-17 (×4): qty 1

## 2020-05-17 MED ORDER — TETANUS-DIPHTH-ACELL PERTUSSIS 5-2.5-18.5 LF-MCG/0.5 IM SUSY: 0.5000 mL | PREFILLED_SYRINGE | Freq: Once | INTRAMUSCULAR | Status: DC

## 2020-05-17 MED ORDER — DIPHENHYDRAMINE HCL 25 MG PO CAPS: 25.0000 mg | ORAL_CAPSULE | Freq: Four times a day (QID) | ORAL | Status: DC | PRN

## 2020-05-17 MED ORDER — SODIUM CHLORIDE 0.9 % IV SOLN
8.0000 mg | Freq: Once | INTRAVENOUS | Status: DC
  Filled 2020-05-17: qty 4

## 2020-05-17 MED ORDER — FENTANYL 2.5 MCG/ML W/ROPIVACAINE 0.15% IN NS 100 ML EPIDURAL (ARMC)
EPIDURAL | Status: DC | PRN
  Administered 2020-05-17: 12 mL/h via EPIDURAL

## 2020-05-17 MED ORDER — WITCH HAZEL-GLYCERIN EX PADS
1.0000 "application " | MEDICATED_PAD | CUTANEOUS | Status: DC | PRN
  Administered 2020-05-17: 1 via TOPICAL
  Filled 2020-05-17: qty 100

## 2020-05-17 NOTE — Progress Notes (Signed)
Nutrition Brief Note  Patient identified on the Malnutrition Screening Tool (MST) Report  Wt Readings from Last 15 Encounters:  05/16/20 72.1 kg  05/13/20 72.3 kg  05/11/20 72.6 kg  05/04/20 73.5 kg  04/29/20 74.2 kg  04/27/20 72.6 kg  04/23/20 73 kg  04/20/20 72.6 kg  04/06/20 73 kg  03/23/20 70.3 kg  03/20/20 69.4 kg  03/16/20 69.4 kg  03/10/20 70.3 kg  02/28/20 68 kg  02/24/20 68.9 kg   Haliyah N Haye is a 20 y.o. female presenting for induction of labor secondary to cholestasis.   Reviewed I/O's: +1.2 L x 24 hours  Pregravid wt: 171#.   Medications reviewed and include pitocin.   Labs reviewed.   Current diet order is regular, patient is consuming approximately n/a% of meals at this time. Labs and medications reviewed.   No nutrition interventions warranted at this time. If nutrition issues arise, please consult RD.   Levada Schilling, RD, LDN, CDCES Registered Dietitian II Certified Diabetes Care and Education Specialist Please refer to [facility] for RD and/or RD on-call/weekend/after hours pager

## 2020-05-17 NOTE — Progress Notes (Signed)
Adriana Kerr is a 20 y.o. G3P0020 at [redacted]w[redacted]d by ultrasound admitted for induction of labor due to cholestasis..  Subjective: patient requested an epidural shortly after AROM.  She is anxious, moving constantly in the bed, and reports constant back pain across her entire back.  Husband at bedside, very patiently supporting her verbally.  Using epidural patient-controlled button to administer additonal medication.  She denies any urge to push or rectal pressure. Declines offer of counterpressure on her back.  Continues leaking clear fluid.   Objective: BP (!) 136/96   Pulse (!) 53   Temp 98.4 F (36.9 C) (Oral)   Resp 18   Ht 5\' 3"  (1.6 m)   Wt 72.1 kg   LMP 08/26/2019   SpO2 100%   BMI 28.17 kg/m  I/O last 3 completed shifts: In: 1226.5 [I.V.:1226.5] Out: -  No intake/output data recorded.  FHT:  FHR: 130 bpm, variability: moderate,  accelerations:  Present,  decelerations:  Absent UC:   regular, every 3 minutes. Difficulty tracing secondary to her ongoing position changes and aversion to the toco. (She has not tolerated the external  Monitoring for much of this induction) SVE:   Dilation: 3 Effacement (%): 80 Station: -2 Exam by:: [email or web address], CNM  At 0500  Labs: Lab Results  Component Value Date   WBC 5.8 05/16/2020   HGB 10.1 (L) 05/16/2020   HCT 31.4 (L) 05/16/2020   MCV 84.2 05/16/2020   PLT 166 05/16/2020    Assessment / Plan: Induction of labor due to cholestasis,  progressing well on pitocin  Labor: Progressing normally  Fetal Wellbeing:  Category I Pain Control:  Epidural I/D:  n/a Anticipated MOD:  NSVD  Hospital heating pad ordered. Position chnages suggested to better distribute anesthesia. Consider anesthesia consult for additional bolus.  Pitocin remains at 14 mu/min - will likely place IUPC and titrate the pitocin to achieve otpimal contraction pattern.  05/18/2020 05/17/2020, 7:59 AM

## 2020-05-17 NOTE — Lactation Note (Signed)
This note was copied from a baby's chart. Lactation Consultation Note  Patient Name: Adriana Kerr [name] Date: 05/17/2020 Reason for consult: L&D Initial assessment;1st time breastfeeding;Early term 37-38.6wks Age:21 hours Lactation to L&D room for initial visit. Mother is holding the baby skin to skin. Encouraged feeding on demand and with cues. If baby is not cueing encouraged hand expression and skin to skin. Taught proper technique for hand expression and spoon feeding. LC and Mother expressed about 48ml's but baby would only take about 71ml. She is not interested in feeding at this time and was left STS with Mother. Encouraged 8 or more attempts in the first 24 hours and 8 or more good feeds after 24 HOL. Reviewed appropriate diapers for days of life and How to know your baby is getting enough to eat. Reviewed "Understanding Postpartum and Newborn Care" INJOY booklet at bedside.  Maternal Data Has patient been taught Hand Expression?: Yes Does the patient have breastfeeding experience prior to this delivery?: No  Feeding Mother's Current Feeding Choice: Breast Milk  LATCH Score Latch: Too sleepy or reluctant, no latch achieved, no sucking elicited.  Interventions Interventions: Breast feeding basics reviewed;Skin to skin;Assisted with latch;Hand express;Education  Discharge Pump:  (none) WIC Program: Yes  Consult Status Consult Status: Follow-up Date: 05/18/20 Follow-up type: Call as needed    Iraida Cragin D Triva Hueber 05/17/2020, 2:30 PM

## 2020-05-17 NOTE — Progress Notes (Signed)
  Labor Progress Note   20 y.o. [id number] @ [redacted]w[redacted]d , admitted for  Pregnancy, Labor Management. IOL cholestasis  Subjective:  Having some back pain with contractions  Objective:  BP (!) 136/96   Pulse (!) 53   Temp 98.1 F (36.7 C) (Oral)   Resp 18   Ht 5\' 3"  (1.6 m)   Wt 72.1 kg   LMP 08/26/2019   SpO2 100%   BMI 28.17 kg/m  Abd: gravid, ND, FHT present, mild tenderness on exam Extr: no edema SVE: CERVIX: 10 cm dilated, 100 effaced, +1 station  EFM: FHR: 120 bpm, variability: moderate,  accelerations:  Present,  decelerations:  Present some decelerations to 100s and unable to correlate with contractions Toco: Frequency: Every 5 minutes per patient report Labs: I have reviewed the patient's lab results.  Pitocin had been turned off due to decelerations   Assessment & Plan:  10/27/2019 @ [redacted]w[redacted]d, admitted for  Pregnancy and Labor/Delivery Management  1. Pain management: epidural. 2. FWB: FHT category II and overall reassuring.  3. ID: GBS negative 4. Labor management: restart pitocin at 2 mu/min, labor down  All discussed with patient, see orders   [redacted]w[redacted]d, CNM [facility] 05/17/2020  8:53 AM

## 2020-05-17 NOTE — Anesthesia Procedure Notes (Addendum)
Epidural Patient location during procedure: OB Start time: 05/17/2020 6:15 AM End time: 05/17/2020 6:21 AM  Staffing Anesthesiologist: Neeley Sedivy, Cleda Mccreedy, MD Performed: anesthesiologist   Preanesthetic Checklist Completed: patient identified, IV checked, site marked, risks and benefits discussed, surgical consent, monitors and equipment checked, pre-op evaluation and timeout performed  Epidural Patient position: sitting Prep: ChloraPrep Patient monitoring: heart rate, continuous pulse ox and blood pressure Approach: midline Location: L3-L4 Injection technique: LOR saline  Needle:  Needle type: Tuohy  Needle gauge: 17 G Needle length: 9 cm and 9 Needle insertion depth: 5 cm Catheter type: closed end flexible Catheter size: 19 Gauge Catheter at skin depth: 10 cm Test dose: negative and 1.5% lidocaine with Epi 1:200 K  Assessment Sensory level: T10 Events: blood not aspirated, injection not painful, no injection resistance, no paresthesia and negative IV test  Additional Notes 2 attempts Patient reports baseline right leg and foot weakness and numbness from prior knee surgeries. Pt. Evaluated and documentation done after procedure finished. Patient identified. Risks/Benefits/Options discussed with patient including but not limited to bleeding, infection, nerve damage, paralysis, failed block, incomplete pain control, headache, blood pressure changes, nausea, vomiting, reactions to medication both or allergic, itching and postpartum back pain. Confirmed with bedside nurse the patient's most recent platelet count. Confirmed with patient that they are not currently taking any anticoagulation, have any bleeding history or any family history of bleeding disorders. Patient expressed understanding and wished to proceed. All questions were answered. Sterile technique was used throughout the entire procedure. Please see nursing notes for vital signs. Test dose was given through epidural  catheter and negative prior to continuing to dose epidural or start infusion. Warning signs of high block given to the patient including shortness of breath, tingling/numbness in hands, complete motor block, or any concerning symptoms with instructions to call for help. Patient was given instructions on fall risk and not to get out of bed. All questions and concerns addressed with instructions to call with any issues or inadequate analgesia.   Patient tolerated the insertion well without immediate complications.Reason for block:procedure for pain

## 2020-05-17 NOTE — Progress Notes (Signed)
Adriana Kerr is a 20 y.o. G3P0020 at [redacted]w[redacted]d by ultrasound admitted for induction of labor due to cholestasis.  Subjective: She has been resting, napping over the last few hours. Shares that she does not want to go past 24 hours without delivering, and will request a Cesarean section if she is not delivered.  She reports contractions are mild, and felt in the lower abdomen and across her back.  She is amenable to AROM   Objective: BP 140/81 (BP Location: Left Arm)   Pulse 64   Temp 98.4 F (36.9 C) (Oral)   Resp 18   Ht 5\' 3"  (1.6 m)   Wt 72.1 kg   LMP 08/26/2019   BMI 28.17 kg/m  I/O last 3 completed shifts: In: 1226.5 [I.V.:1226.5] Out: -  No intake/output data recorded.  FHT:  FHR: 135 baseline bpm, variability: moderate,  accelerations:  Present,  decelerations:  Absent UC:   regular, every 2-2.5  minutes SVE:   Dilation: 3 Effacement (%): 80 Station: -2 Exam by:: [email or web address], CNM  Pitocin running at 20 mu/min.  Labs: Lab Results  Component Value Date   WBC 5.8 05/16/2020   HGB 10.1 (L) 05/16/2020   HCT 31.4 (L) 05/16/2020   MCV 84.2 05/16/2020   PLT 166 05/16/2020    Assessment / Plan: Induction of labor due to cholestasis,  Little progress since starting on pitocin. Patient has birth preferences- avoid vaginal exams; no procedures without discussing first  Labor: Progressing normally  Fetal Wellbeing:  Category I Pain Control:  Labor support without medications  Will likely request epidural I/D:  n/a Anticipated MOD:  NSVD   With her consent, and after discussing, AROM performed. Clear fluid seen. Epidural when she requests. Titrate the pitocin carefully. Rate dropped to 55mu/min. Expectant mangement.   13m 05/17/2020, 5:13 AM

## 2020-05-17 NOTE — Discharge Summary (Signed)
OB Discharge Summary     Patient Name: Adriana Kerr DOB: 27-Sep-2000 MRN: [id number]  Date of admission: 05/16/2020 Delivering provider: Rod Can, CNM  Date of Delivery: 05/17/2020  Date of discharge: 05/18/2020  Admitting diagnosis: Cholestasis of pregnancy in third trimester [O26.613, K83.1] Intrauterine pregnancy: [redacted]w[redacted]d    Secondary diagnosis: None     Discharge diagnosis: Term Pregnancy Delivered, cholestasis of pregnancy                                                                                             Post partum procedures:none  Augmentation: AROM, Pitocin and Cytotec  Complications: none  Hospital course:  Induction of Labor With Vaginal Delivery   20y.o. yo [id number] admitted to the hospital 05/16/2020 for induction of labor.  Indication for induction: Cholestasis of pregnancy. Patient had an uncomplicated labor course as follows: Membrane Rupture Time/Date: 5:01 AM ,05/17/2020   Delivery Method:Vaginal, Spontaneous  Episiotomy: None  Lacerations:  None  Details of delivery can be found in separate delivery note.    Patient had a routine postpartum course.   Patient is discharged home 05/18/20.  Newborn Data: Birth date:05/17/2020  Birth time:1:11 PM  Gender:Female  [place] Living status:Living  Apgars:9 ,9  Weight: 2830 g, 6 pounds, 3.8 ounces  Physical exam  Vitals:   05/17/20 2300 05/18/20 0309 05/18/20 0719 05/18/20 0816  BP: 112/82 108/81 122/79 (!) 132/95  Pulse: 76 76 76 84  Resp: '18 18 17 18  ' Temp: 97.9 F (36.6 C) 97.8 F (36.6 C) 97.9 F (36.6 C) 97.9 F (36.6 C)  TempSrc: Oral Oral Oral   SpO2: 99% 100% 98% 99%  Weight:      Height:       General: alert, cooperative and no distress Lochia: appropriate Uterine Fundus: firm Incision: N/A DVT Evaluation: No evidence of DVT seen on physical exam. Negative Homan's sign.  Labs: Lab Results  Component Value Date   WBC 11.0 (H) 05/18/2020   HGB 7.4 (L)  05/18/2020   HCT 23.2 (L) 05/18/2020   MCV 83.8 05/18/2020   PLT 156 05/18/2020    Discharge instruction: per After Visit Summary.  Medications:  Allergies as of 05/18/2020      Reactions   Amoxicillin Hives   Augmentin [amoxicillin-pot Clavulanate] Rash   Codeine Nausea And Vomiting   Flexeril [cyclobenzaprine] Tinitus   Agitation and disorientation, muscle cramps   Tylenol With Codeine #3 [acetaminophen-codeine] Other (See Comments)   emesis      Medication List    STOP taking these medications   Accu-Chek Nano SmartView w/Device Kit   Accu-Chek Softclix Lancets lancets   glucose blood test strip   Prenatal (w/Iron & FA) 27-0.8 MG Tabs   promethazine 25 MG suppository Commonly known as: Phenergan   ursodiol 500 MG tablet Commonly known as: ACTIGALL     TAKE these medications   ibuprofen 600 MG tablet Commonly known as: ADVIL Take 1 tablet (600 mg total) by mouth every 6 (six) hours.       Diet: routine diet  Activity: Advance as tolerated. Pelvic rest for 6  weeks.   Outpatient follow up:  Follow-up Information    Rod Can, CNM. Schedule an appointment as soon as possible for a visit in 2 week(s).   Specialty: Obstetrics Why: postpartum follow up . Please make an appointment in 2 weeks wtih Rod Can and again in 6 weeks for your postpartum physical Contact information: [address] [place] [address] [phone]                 Postpartum contraception: Undecided Rhogam Given postpartum: NA Rubella vaccine given postpartum: Immune Varicella vaccine given postpartum: Immune TDaP given antepartum or postpartum: declined   Newborn Delivery   Birth date/time: 05/17/2020 13:11:00 Delivery type: Vaginal, Spontaneous      Baby Feeding: Breast  Disposition:home with mother  SIGNED:  Imagene Riches, CNM 05/18/2020 11:29 AM

## 2020-05-17 NOTE — Progress Notes (Signed)
RN initiated blood pressure sequence after epidural placement. Epidural began infusing at 629. Blood pressure sequence initiated at 634. Due to maternal anxiety and request, RN unable to take initial blood pressure until 641. Attempt to take blood pressure at 643 was interrupted. Support person detached blood pressure cuff while cuff was inflating. RN repositioned blood pressure cuff. Subsequent blood pressures were 160/84, 162/87. RN advised patient that we could relocate blood pressure cuff to calf of opposite arm. Pt forcefully declined options to move blood pressure cuff.

## 2020-05-17 NOTE — Anesthesia Preprocedure Evaluation (Signed)
Anesthesia Evaluation  Patient identified by MRN, date of birth, ID band Patient awake    Reviewed: Allergy & Precautions, H&P , NPO status , Patient's Chart, lab work & pertinent test results  History of Anesthesia Complications (+) PONV and history of anesthetic complications  Airway Mallampati: III  TM Distance: >3 FB Neck ROM: full    Dental  (+) Chipped   Pulmonary neg pulmonary ROS,    Pulmonary exam normal        Cardiovascular Exercise Tolerance: Good negative cardio ROS Normal cardiovascular exam     Neuro/Psych  Headaches, PSYCHIATRIC DISORDERS    GI/Hepatic negative GI ROS,   Endo/Other    Renal/GU   negative genitourinary   Musculoskeletal   Abdominal   Peds  Hematology negative hematology ROS (+)   Anesthesia Other Findings Patient reports baseline right leg and foot weakness and numbness from prior knee surgeries.  Patient reports baseline nausea and hiccups prior to epidural placement.  Past Medical History: No date: Anxiety No date: Depression No date: Migraines No date: PONV (postoperative nausea and vomiting)  Past Surgical History: No date: ADENOIDECTOMY No date: ELBOW SURGERY No date: KNEE SURGERY No date: TONSILLECTOMY  BMI    Body Mass Index: 28.17 kg/m      Reproductive/Obstetrics (+) Pregnancy                             Anesthesia Physical Anesthesia Plan  ASA: II  Anesthesia Plan: Epidural   Post-op Pain Management:    Induction:   PONV Risk Score and Plan:   Airway Management Planned: Natural Airway  Additional Equipment:   Intra-op Plan:   Post-operative Plan:   Informed Consent: I have reviewed the patients History and Physical, chart, labs and discussed the procedure including the risks, benefits and alternatives for the proposed anesthesia with the patient or authorized representative who has indicated his/her understanding  and acceptance.     Dental Advisory Given  Plan Discussed with: Anesthesiologist  Anesthesia Plan Comments: (Patient reports no bleeding problems and no anticoagulant use.   Patient consented for risks of anesthesia including but not limited to:  - adverse reactions to medications - risk of bleeding, infection and or nerve damage from epidural that could lead to paralysis - risk of headache or failed epidural - nerve damage due to positioning - that if epidural is used for C-section that there is a chance of epidural failure requiring spinal placement or conversion to GA - Damage to heart, brain, lungs, other parts of body or loss of life  Patient voiced understanding.)        Anesthesia Quick Evaluation

## 2020-05-18 LAB — CBC
HCT: 23.2 % — ABNORMAL LOW (ref 36.0–46.0)
Hemoglobin: 7.4 g/dL — ABNORMAL LOW (ref 12.0–15.0)
MCH: 26.7 pg (ref 26.0–34.0)
MCHC: 31.9 g/dL (ref 30.0–36.0)
MCV: 83.8 fL (ref 80.0–100.0)
Platelets: 156 10*3/uL (ref 150–400)
RBC: 2.77 MIL/uL — ABNORMAL LOW (ref 3.87–5.11)
RDW: 13.4 % (ref 11.5–15.5)
WBC: 11 10*3/uL — ABNORMAL HIGH (ref 4.0–10.5)
nRBC: 0 % (ref 0.0–0.2)

## 2020-05-18 LAB — BILE ACIDS, TOTAL: Bile Acids Total: 1.6 umol/L (ref 0.0–10.0)

## 2020-05-18 MED ORDER — IBUPROFEN 600 MG PO TABS: 600.0000 mg | ORAL_TABLET | Freq: Four times a day (QID) | ORAL | 0 refills | Status: DC

## 2020-05-18 NOTE — Progress Notes (Signed)
Mother discharged.  Discharge instructions given.  Mother verbalizes understanding.  Transported by auxiliary.  

## 2020-05-18 NOTE — TOC Progression Note (Addendum)
Transition of Care [facility]) - Progression Note    Patient Details  Name: ESME FREUND MRN: [id number] Date of Birth: [birth year]  [facility] CM/SW Contact  [place] Cellar, RN Phone Number: 05/18/2020, 4:55 PM  Clinical Narrative:    Spoke to patient as patient is eager to discharge home. Husband is at bedside with car seat. Patient was disappointed as she was hopeful for infant to join [employer] [facility] [employer] however due to insurance patient will be going to [employer]. Patient reports she is a [employer] and if she returns to work it will be [employer]. Active with WIC services. Reports history of depression however is closely monitoed by PCP, therapist and support group of friends/family. Patient reports she did use Marijuana during pregnancy due to hyperemesis however was honest with OB throughout pregnancy and does not utilize any drugs for recreational use. Understands need for CPS report and is agreeable. Discussed PPD in detail. No other TOC needs.   CPS report completed.        Expected Discharge Plan and Services           Expected Discharge Date: 05/18/20                                     Social Determinants of Health (SDOH) Interventions    Readmission Risk Interventions No flowsheet data found.

## 2020-05-18 NOTE — Lactation Note (Signed)
This note was copied from a baby's chart. Lactation Consultation Note  Patient Name: Adriana Kerr VOHYW'V Date: 05/18/2020   Age:21 hours   9:50-9:56 am  Lactation to patient room for follow-up visit. Patient states that things are going well with breastfeeding. Lactation student reviewed cluster feeding and infant behavior after 24 hour period. Lactation student spoke to the parents about comfort gels (patient is allergic to coconut oil) and using expressed milk if needed for nipple soreness. Patient asked about spit up and how to know what is "normal". Lactation student explained that spit up can happen in some babies and not in others. Parents should call their pediatrician if they are concerned about spit up. Parents had no further questions. Patient states that baby has just eaten and she will call lactation when baby is ready to feed again.                       Adriana Kerr 05/18/2020, 10:15 AM

## 2020-05-18 NOTE — Discharge Instructions (Signed)

## 2020-05-18 NOTE — Anesthesia Postprocedure Evaluation (Signed)
Anesthesia Post Note  Patient: Adriana Kerr  Procedure(s) Performed: AN AD HOC LABOR EPIDURAL  Patient location during evaluation: Mother Baby Anesthesia Type: Epidural Level of consciousness: awake and alert Pain management: pain level controlled Vital Signs Assessment: post-procedure vital signs reviewed and stable Respiratory status: spontaneous breathing, nonlabored ventilation and respiratory function stable Cardiovascular status: stable Postop Assessment: no headache, no backache and epidural receding Anesthetic complications: no   No complications documented.   Last Vitals:  Vitals:   05/18/20 0719 05/18/20 0816  BP: 122/79 (!) 132/95  Pulse: 76 84  Resp: 17 18  Temp: 36.6 C 36.6 C  SpO2: 98% 99%    Last Pain:  Vitals:   05/18/20 0735  TempSrc:   PainSc: 0-No pain                 [place] Svehla Lawerance Cruel

## 2020-05-18 NOTE — Lactation Note (Signed)
This note was copied from a baby's chart. Lactation Consultation Note  Patient Name: Adriana Kerr FYBOF'B Date: 05/18/2020   Age:21 hours   Lactation to patient room. Lactation student observed baby latched to right breast. Baby latched well and rhythmic sucking was observed.   Lactation student reviewed output and newborn behavior over the next few days. Lactation student spoke with parents about observing the color of baby's stools and the number of wet and dirty diapers that baby should have each day.   Lactation student discussed information about returning to work. Mom stated that she was aware of the milk storage guidelines. Parents asked about how much mom should pump when she begins pumping. Lactation student spoke to parents about how the volume of milk changes as the baby gets older. LC suggested that parents avoid pumping for the first few weeks.   Parents asked about the [employer] pump and the lactation student informed the parents that the [facility] is a pump and can stimulate the breasts if used.   Parents had no further questions and lactation student congratulated them. They will call lactation if needed.    Bethzaida Boord   05/18/2020, 11:14 AM

## 2020-05-18 NOTE — TOC Initial Note (Signed)
Transition of Care [facility]) - Initial/Assessment Note    Patient Details  Name: Adriana Kerr MRN: [id number] Date of Birth: August 15, 2000  [facility] of [facility]) CM/SW Contact:     Cellar, RN Phone Number: 05/18/2020, 1:09 PM  Clinical Narrative:                 Spoke to patients husband, Adriana Kerr due to patient working with staff on birth certificate. Adriana Kerr states patient is doing wonderful and he is shocked at how well she is feeling after having a baby. Patient is working with [facility] with plans to breastfeed. Will outreach to patient at later time today to follow up on [place].        Patient Goals and CMS Choice        Expected Discharge Plan and Services           Expected Discharge Date: 05/18/20                                    Prior Living Arrangements/Services                       Activities of Daily Living Home Assistive Devices/Equipment: None ADL Screening (condition at time of admission) Patient's cognitive ability adequate to safely complete daily activities?: Yes Is the patient deaf or have difficulty hearing?: No Does the patient have difficulty seeing, even when wearing glasses/contacts?: No Does the patient have difficulty concentrating, remembering, or making decisions?: No Patient able to express need for assistance with ADLs?: Yes Does the patient have difficulty dressing or bathing?: No Independently performs ADLs?: Yes (appropriate for developmental age) Does the patient have difficulty walking or climbing stairs?: No Weakness of Legs: None Weakness of Arms/Hands: None  Permission Sought/Granted                  Emotional Assessment              Admission diagnosis:  Cholestasis of pregnancy in third trimester [O26.613, K83.1] Patient Active Problem List   Diagnosis Date Noted  . Postpartum care following vaginal delivery 05/17/2020  . Encounter for care or examination of lactating mother 05/17/2020  .  Cholestasis of pregnancy in third trimester 05/16/2020  . Intrahepatic cholestasis of pregnancy in second trimester 03/01/2020  . Encounter for supervision of high risk pregnancy in third trimester, antepartum 01/29/2020  . 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 Migraine without status migrainosus, not intractable 11/01/2016  . 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 Patellar instability of right knee 01/05/2014  . Patellofemoral dysfunction of right knee 11/18/2013   PCP:  [facility]:   [facility] [place], [place] - [address] [place] [address] Phone: [phone] Fax: [phone]  [id number] - [place], [place] - [address] [place] [address] Phone: [phone] Fax: [phone]     Social Determinants of Health (SDOH) Interventions    Readmission Risk Interventions No flowsheet data found.

## 2020-05-21 ENCOUNTER — Other Ambulatory Visit: Payer: Self-pay | Admitting: Advanced Practice Midwife

## 2020-05-21 ENCOUNTER — Telehealth: Payer: Self-pay

## 2020-05-21 ENCOUNTER — Other Ambulatory Visit: Payer: Medicaid Other

## 2020-05-21 DIAGNOSIS — O9081 Anemia of the puerperium: Secondary | ICD-10-CM

## 2020-05-21 NOTE — Progress Notes (Signed)
Order placed for CBC draw in office. Patient called this morning with complaint of blood clot the size of her fist. She is feeling lightheaded. She has not taken Fe or eaten Fe rich foods during the pregnancy or postpartum due to intolerance. We discussed possible need for iron infusion.

## 2020-05-21 NOTE — Telephone Encounter (Signed)
Left voicemail for patient regarding symptoms and precautions.

## 2020-05-21 NOTE — Telephone Encounter (Signed)
Patient is calling with concerns of passing two big blood clots. Patient reports she got her blood result back and her had questions about her hemoglobin being low and wondered if that could be the reason. Patient is aware [facility] is on Call today and message will be sent to delivering provider.Please advise. Please contact patient at the number if the phone number on file doesn't work. [phone]

## 2020-05-22 LAB — CBC
Hematocrit: 25.6 % — ABNORMAL LOW (ref 34.0–46.6)
Hemoglobin: 8.2 g/dL — ABNORMAL LOW (ref 11.1–15.9)
MCH: 26.4 pg — ABNORMAL LOW (ref 26.6–33.0)
MCHC: 32 g/dL (ref 31.5–35.7)
MCV: 82 fL (ref 79–97)
Platelets: 306 10*3/uL (ref 150–450)
RBC: 3.11 x10E6/uL — ABNORMAL LOW (ref 3.77–5.28)
RDW: 13.3 % (ref 11.7–15.4)
WBC: 7.2 10*3/uL (ref 3.4–10.8)

## 2020-05-31 ENCOUNTER — Inpatient Hospital Stay: Payer: Medicaid Other

## 2020-05-31 ENCOUNTER — Other Ambulatory Visit: Payer: Self-pay

## 2020-05-31 ENCOUNTER — Inpatient Hospital Stay: Payer: Medicaid Other | Attending: Oncology | Admitting: Oncology

## 2020-05-31 ENCOUNTER — Encounter: Payer: Self-pay | Admitting: Oncology

## 2020-05-31 VITALS — BP 122/92 | HR 83 | Temp 97.0°F | Resp 20 | Wt 130.8 lb

## 2020-05-31 DIAGNOSIS — Z8261 Family history of arthritis: Secondary | ICD-10-CM | POA: Diagnosis not present

## 2020-05-31 DIAGNOSIS — Z8269 Family history of other diseases of the musculoskeletal system and connective tissue: Secondary | ICD-10-CM | POA: Insufficient documentation

## 2020-05-31 DIAGNOSIS — Z811 Family history of alcohol abuse and dependence: Secondary | ICD-10-CM | POA: Insufficient documentation

## 2020-05-31 DIAGNOSIS — Z79899 Other long term (current) drug therapy: Secondary | ICD-10-CM | POA: Insufficient documentation

## 2020-05-31 DIAGNOSIS — Z8249 Family history of ischemic heart disease and other diseases of the circulatory system: Secondary | ICD-10-CM | POA: Diagnosis not present

## 2020-05-31 DIAGNOSIS — Z8041 Family history of malignant neoplasm of ovary: Secondary | ICD-10-CM | POA: Diagnosis not present

## 2020-05-31 DIAGNOSIS — Z833 Family history of diabetes mellitus: Secondary | ICD-10-CM | POA: Insufficient documentation

## 2020-05-31 DIAGNOSIS — Z814 Family history of other substance abuse and dependence: Secondary | ICD-10-CM | POA: Insufficient documentation

## 2020-05-31 DIAGNOSIS — D649 Anemia, unspecified: Secondary | ICD-10-CM | POA: Diagnosis present

## 2020-05-31 DIAGNOSIS — Z823 Family history of stroke: Secondary | ICD-10-CM | POA: Insufficient documentation

## 2020-05-31 DIAGNOSIS — Z803 Family history of malignant neoplasm of breast: Secondary | ICD-10-CM | POA: Insufficient documentation

## 2020-05-31 DIAGNOSIS — R109 Unspecified abdominal pain: Secondary | ICD-10-CM | POA: Insufficient documentation

## 2020-05-31 DIAGNOSIS — R5383 Other fatigue: Secondary | ICD-10-CM | POA: Diagnosis not present

## 2020-05-31 LAB — COMPREHENSIVE METABOLIC PANEL
ALT: 17 U/L (ref 0–44)
AST: 20 U/L (ref 15–41)
Albumin: 4.4 g/dL (ref 3.5–5.0)
Alkaline Phosphatase: 126 U/L (ref 38–126)
Anion gap: 10 (ref 5–15)
BUN: 23 mg/dL — ABNORMAL HIGH (ref 6–20)
CO2: 24 mmol/L (ref 22–32)
Calcium: 9.3 mg/dL (ref 8.9–10.3)
Chloride: 104 mmol/L (ref 98–111)
Creatinine, Ser: 0.76 mg/dL (ref 0.44–1.00)
GFR, Estimated: >60 mL/min (ref >60)
Glucose, Bld: 81 mg/dL (ref 70–99)
Potassium: 3.6 mmol/L (ref 3.5–5.1)
Sodium: 138 mmol/L (ref 135–145)
Total Bilirubin: 0.5 mg/dL (ref 0.3–1.2)
Total Protein: 7.7 g/dL (ref 6.5–8.1)

## 2020-05-31 LAB — CBC WITH DIFFERENTIAL/PLATELET
Abs Immature Granulocytes: 0.01 10*3/uL (ref 0.00–0.07)
Basophils Absolute: 0.1 10*3/uL (ref 0.0–0.1)
Basophils Relative: 2 %
Eosinophils Absolute: 0 10*3/uL (ref 0.0–0.5)
Eosinophils Relative: 1 %
HCT: 31.6 % — ABNORMAL LOW (ref 36.0–46.0)
Hemoglobin: 9.8 g/dL — ABNORMAL LOW (ref 12.0–15.0)
Immature Granulocytes: 0 %
Lymphocytes Relative: 49 %
Lymphs Abs: 2.2 10*3/uL (ref 0.7–4.0)
MCH: 26 pg (ref 26.0–34.0)
MCHC: 31 g/dL (ref 30.0–36.0)
MCV: 83.8 fL (ref 80.0–100.0)
Monocytes Absolute: 0.3 10*3/uL (ref 0.1–1.0)
Monocytes Relative: 6 %
Neutro Abs: 1.9 10*3/uL (ref 1.7–7.7)
Neutrophils Relative %: 42 %
Platelets: 413 10*3/uL — ABNORMAL HIGH (ref 150–400)
RBC: 3.77 MIL/uL — ABNORMAL LOW (ref 3.87–5.11)
RDW: 13.9 % (ref 11.5–15.5)
WBC: 4.4 10*3/uL (ref 4.0–10.5)
nRBC: 0 % (ref 0.0–0.2)

## 2020-05-31 LAB — RETICULOCYTES
Immature Retic Fract: 20.6 % — ABNORMAL HIGH (ref 2.3–15.9)
RBC.: 3.79 MIL/uL — ABNORMAL LOW (ref 3.87–5.11)
Retic Count, Absolute: 79.6 10*3/uL (ref 19.0–186.0)
Retic Ct Pct: 2.1 % (ref 0.4–3.1)

## 2020-05-31 LAB — FOLATE: Folate: 9.1 ng/mL (ref >5.9)

## 2020-05-31 LAB — FERRITIN: Ferritin: 5 ng/mL — ABNORMAL LOW (ref 11–307)

## 2020-05-31 LAB — IRON AND TIBC
Iron: 15 ug/dL — ABNORMAL LOW (ref 28–170)
Saturation Ratios: 2 % — ABNORMAL LOW (ref 10.4–31.8)
TIBC: 616 ug/dL — ABNORMAL HIGH (ref 250–450)
UIBC: 601 ug/dL

## 2020-05-31 LAB — VITAMIN B12: Vitamin B-12: 278 pg/mL (ref 180–914)

## 2020-05-31 LAB — TSH: TSH: 0.756 u[IU]/mL (ref 0.350–4.500)

## 2020-05-31 NOTE — Progress Notes (Signed)
Hematology/Oncology Consult note [facility] [phone] [phone]  Patient Care Team[facility] - [facility] (Family Medicine)   Name of the patient: Adriana CharMikaela Kerr  [id number]  Nov 19, 2000    Reason for referral-anemia   Referring physician-Dr. Higinio RogerGledhill  Date of visit: 05/31/20   History of presenting illness-patient is a 20 year old female currently 1 month postpartum after the delivery of her first child.  Pregnancy was complicated by cholestasisBut delivery was uneventful.  Patient has been referred to us for anemia.  Patient's hemoglobin was stable between 11-12 up until January 2022 and then it drifted down to 10 in February and March 2022.  She delivered on 05/17/2020 and hemoglobin was 7.4 on 3/29.  During her pregnancy patient had significant nausea and vomiting and was not able to eat much.  Presently patient reports feeling a whole lot better.  She has tried to take oral iron tablets in the past but reports having significant abdominal pain.  Currently she reports fatigue when she walks more than a block.  Denies any blood loss in her stool.  She is having routine postpartum bleeding which is slowing down.  ECOG PS- 1  Pain scale- 0   Review of systems- Review of Systems  Constitutional: Positive for malaise/fatigue. Negative for chills, fever and weight loss.  HENT: Negative for congestion, ear discharge and nosebleeds.   Eyes: Negative for blurred vision.  Respiratory: Negative for cough, hemoptysis, sputum production, shortness of breath and wheezing.   Cardiovascular: Negative for chest pain, palpitations, orthopnea and claudication.  Gastrointestinal: Negative for abdominal pain, blood in stool, constipation, diarrhea, heartburn, melena, nausea and vomiting.  Genitourinary: Negative for dysuria, flank pain, frequency, hematuria and urgency.  Musculoskeletal: Negative for back pain, joint pain and  myalgias.  Skin: Negative for rash.  Neurological: Negative for dizziness, tingling, focal weakness, seizures, weakness and headaches.  Endo/Heme/Allergies: Does not bruise/bleed easily.  Psychiatric/Behavioral: Negative for depression and suicidal ideas. The patient does not have insomnia.     Allergies  Allergen Reactions  . Amoxicillin Hives  . Augmentin [Amoxicillin-Pot Clavulanate] Rash  . Codeine Nausea And Vomiting  . Flexeril [Cyclobenzaprine] Tinitus    Agitation and disorientation, muscle cramps  . Tylenol With Codeine #3 [Acetaminophen-Codeine] Other (See Comments)    emesis    Patient Active Problem List   Diagnosis Date Noted  . Postpartum care following vaginal delivery 05/17/2020  . Encounter for care or examination of lactating mother 05/17/2020  . Cholestasis of pregnancy in third trimester 05/16/2020  . Intrahepatic cholestasis of pregnancy in second trimester 03/01/2020  . Encounter for supervision of high risk pregnancy in third trimester, antepartum 01/29/2020  . 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 Migraine without status migrainosus, not intractable 11/01/2016  . 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 Patellar instability of right knee 01/05/2014  . Patellofemoral dysfunction of right knee 11/18/2013     Past Medical History:  Diagnosis Date  . Anxiety   . Depression   . Migraines   . PONV (postoperative nausea and vomiting)      Past Surgical History:  Procedure Laterality Date  . ADENOIDECTOMY    . ELBOW SURGERY    . KNEE SURGERY    . TONSILLECTOMY      Social History   Socioeconomic History  . Marital status: Married    Spouse name: Alycia RossettiRyan   . Number of children: Not on file  . Years of education: Not  on file  . Highest education level: Not on file  Occupational History  . Not on file  Tobacco Use  . Smoking status: Passive Smoke Exposure - Never Smoker  . Smokeless tobacco: Never Used  Vaping Use   . Vaping Use: Never used  Substance and Sexual Activity  . Alcohol use: No  . Drug use: Yes    Types: Marijuana    Comment: Last use 04/29/20  . Sexual activity: Yes    Birth control/protection: None  Other Topics Concern  . Not on file  Social History Narrative  . Not on file   Social Determinants of Health   Financial Resource Strain: Not on file  Food Insecurity: Not on file  Transportation Needs: Not on file  Physical Activity: Not on file  Stress: Not on file  Social Connections: Not on file  Intimate Partner Violence: Not on file     Family History  Problem Relation Age of Onset  . Diabetes type II Paternal Grandfather   . Ovarian cancer Maternal Grandmother   . Migraines Maternal Grandmother   . Breast cancer Paternal Grandmother 76  . Ulcers Mother   . Fibromyalgia Mother   . Migraines Mother   . Drug abuse Mother   . Thyroid disease Father   . Alcohol abuse Father   . Migraines Sister   . Turner syndrome Maternal Aunt   . Crohn's disease Maternal Aunt   . Stroke Maternal Aunt   . Arthritis Maternal Aunt      Current Outpatient Medications:  .  ibuprofen (ADVIL) 600 MG tablet, Take 1 tablet (600 mg total) by mouth every 6 (six) hours. (Patient not taking: Reported on 05/31/2020), Disp: 30 tablet, Rfl: 0 .  rizatriptan (MAXALT) 5 MG tablet, Take by mouth. (Patient not taking: Reported on 05/31/2020), Disp: , Rfl:    Physical exam:  Vitals:   05/31/20 1341  BP: (!) 122/92  Pulse: 83  Resp: 20  Temp: (!) 97 F (36.1 C)  TempSrc: Tympanic  SpO2: 100%  Weight: 130 lb 12.8 oz (59.3 kg)   Physical Exam Constitutional:      General: She is not in acute distress. Cardiovascular:     Rate and Rhythm: Normal rate and regular rhythm.     Heart sounds: Normal heart sounds.  Pulmonary:     Effort: Pulmonary effort is normal.     Breath sounds: Normal breath sounds.  Abdominal:     General: Bowel sounds are normal.     Palpations: Abdomen is soft.   Skin:    General: Skin is warm and dry.  Neurological:     Mental Status: She is alert and oriented to person, place, and time.        CMP Latest Ref Rng & Units 05/17/2020  Glucose 70 - 99 mg/dL 95  BUN 6 - 20 mg/dL 9  Creatinine 3.30 - 0.76 mg/dL 2.26  Sodium 333 - 545 mmol/L 135  Potassium 3.5 - 5.1 mmol/L 3.6  Chloride 98 - 111 mmol/L 106  CO2 22 - 32 mmol/L 18(L)  Calcium 8.9 - 10.3 mg/dL 6.2(B)  Total Protein 6.5 - 8.1 g/dL 6.3(S)  Total Bilirubin 0.3 - 1.2 mg/dL 1.2  Alkaline Phos 38 - 126 U/L 163(H)  AST 15 - 41 U/L 37  ALT 0 - 44 U/L 12   CBC Latest Ref Rng & Units 05/31/2020  WBC 4.0 - 10.5 K/uL 4.4  Hemoglobin 12.0 - 15.0 g/dL 9.3(T)  Hematocrit 34.2 - 46.0 %  31.6(L)  Platelets 150 - 400 K/uL 413(H)    No images are attached to the encounter.  [place] MFM OB LIMITED  Result Date: 05/13/2020 ----------------------------------------------------------------------  OBSTETRICS REPORT                       (Signed Final 05/13/2020 01:08 pm) ---------------------------------------------------------------------- Patient Info  ID #:       [id number]                          D.O.B.:  Apr 22, 2000 (19 yrs)  Name:       KARMEN ALTAMIRANO [facility]               Visit Date: 05/13/2020 12:51 pm ---------------------------------------------------------------------- Performed By  Attending:        Lin Landsman      Referred By:      Jackelyn Poling                    MD                                       FRYER  Performed By:     Criselda Peaches         [facility] at                                                             [facility] ---------------------------------------------------------------------- Orders  #  Description                           Code        Ordered By  1  [place] MFM OB LIMITED                     32202.54    Thomasene Mohair  ----------------------------------------------------------------------  #  Order #                     Accession #                Episode #  1  [id number]                   [id number]                 [id number] ---------------------------------------------------------------------- Indications  Cholestasis of pregnancy, third trimester      S85.462V03.5  [redacted] weeks gestation of pregnancy                Z3A.36 ---------------------------------------------------------------------- Fetal Evaluation  Num Of Fetuses:         1  Fetal Heart Rate(bpm):  153  Cardiac Activity:       Observed  Presentation:  Cephalic  Placenta:               Anterior  AFI Sum(cm)     %Tile       Largest Pocket(cm)  22.4            86          9.7  RUQ(cm)       RLQ(cm)       LUQ(cm)        LLQ(cm)  9.7           6.2           2.1            4.4 ---------------------------------------------------------------------- OB History  Gravidity:    3         Term:   0        Prem:   0        SAB:   2  TOP:          0       Ectopic:  0        Living: 0 ---------------------------------------------------------------------- Gestational Age  Best:          36w 4d     Det. By:  Marcella Dubs         EDD:   06/06/20                                      (10/14/19) ---------------------------------------------------------------------- Impression  Limited exam to assess amniotic fluid for cholestasis in  pregnancy  Good fetal movement and amniotic fluid volume. ---------------------------------------------------------------------- Recommendations  Planned delivery on Sunday 05/16/20 ----------------------------------------------------------------------               Lin Landsman, MD Electronically Signed Final Report   05/13/2020 01:08 pm ----------------------------------------------------------------------   Assessment and plan- Patient is a 20 y.o. female referred for postpartum normocytic anemia  Patient had significant anemia postpartum  and it dropped down to 7.4 but improved to 8.4 10 days ago.  Today I will do a complete anemia work-up including CBC with differential, CMP, ferritin and iron studies, B12 and folate, TSH, reticulocyte count, haptoglobin and myeloma panel.I will see her back in 10 days time for a video visit   Thank you for this kind referral and the opportunity to participate in the care of this patient   Visit Diagnosis 1. Normocytic anemia     Dr. Owens Shark, MD, MPH [facility] at [facility] [phone] 05/31/2020 2:41 PM

## 2020-06-01 LAB — MULTIPLE MYELOMA PANEL, SERUM
Albumin SerPl Elph-Mcnc: 3.7 g/dL (ref 2.9–4.4)
Albumin/Glob SerPl: 1.2 (ref 0.7–1.7)
Alpha 1: 0.4 g/dL (ref 0.0–0.4)
Alpha2 Glob SerPl Elph-Mcnc: 1 g/dL (ref 0.4–1.0)
B-Globulin SerPl Elph-Mcnc: 1.2 g/dL (ref 0.7–1.3)
Gamma Glob SerPl Elph-Mcnc: 0.6 g/dL (ref 0.4–1.8)
Globulin, Total: 3.2 g/dL (ref 2.2–3.9)
IgA: 125 mg/dL (ref 87–352)
IgG (Immunoglobin G), Serum: 578 mg/dL — ABNORMAL LOW (ref 586–1602)
IgM (Immunoglobulin M), Srm: 119 mg/dL (ref 26–217)
Total Protein ELP: 6.9 g/dL (ref 6.0–8.5)

## 2020-06-01 LAB — HAPTOGLOBIN: Haptoglobin: 247 mg/dL (ref 33–278)

## 2020-06-02 ENCOUNTER — Other Ambulatory Visit: Payer: Self-pay

## 2020-06-02 ENCOUNTER — Ambulatory Visit (INDEPENDENT_AMBULATORY_CARE_PROVIDER_SITE_OTHER): Payer: Medicaid Other | Admitting: Advanced Practice Midwife

## 2020-06-02 ENCOUNTER — Encounter: Payer: Self-pay | Admitting: Advanced Practice Midwife

## 2020-06-02 NOTE — Progress Notes (Signed)
Postpartum Visit  Chief Complaint:  Chief Complaint  Patient presents with  . Postpartum Care    History of Present Illness: Patient is a 20 y.o. [id number] presents for 2 week postpartum mood check visit. She reports feeling well physically and mentally compared to the anxiety and discomforts she had during the pregnancy. She stopped breastfeeding due to both maternal and newborn issues. She has had a visit with hematology and is most likely going to have iron infusions based on lab results.  Review the Delivery Report for details.    Date of delivery: 05/17/2020 Type of delivery: Vaginal delivery - Vacuum or forceps assisted  no Episiotomy No.  Laceration: no  Pregnancy or labor problems:  Hyperemesis, Intrahepatic Cholestasis third trimester Any problems since the delivery:  anemia  Newborn Details:  SINGLETON :  1. BabyGender female. Birth weight: 6 pounds 4 ounces Maternal Details:  Breast or formula feeding: formula Intercourse: No  Contraception after delivery: No she declines all forms Any bowel or bladder issues: No  Post partum depression/anxiety noted:  Very mild Edinburgh Post-Partum Depression Score: 6   Review of Systems: Review of Systems  Constitutional: Positive for malaise/fatigue. Negative for chills and fever.  HENT: Negative for congestion, ear discharge, ear pain, hearing loss, sinus pain and sore throat.   Eyes: Negative for blurred vision and double vision.  Respiratory: Negative for cough, shortness of breath and wheezing.   Cardiovascular: Negative for chest pain, palpitations and leg swelling.  Gastrointestinal: Negative for abdominal pain, blood in stool, constipation, diarrhea, heartburn, melena, nausea and vomiting.  Genitourinary: Negative for dysuria, flank pain, frequency, hematuria and urgency.  Musculoskeletal: Negative for back pain, joint pain and myalgias.  Skin: Negative for itching and rash.  Neurological: Negative for dizziness,  tingling, tremors, sensory change, speech change, focal weakness, seizures, loss of consciousness, weakness and headaches.  Endo/Heme/Allergies: Negative for environmental allergies. Does not bruise/bleed easily.  Psychiatric/Behavioral: Negative for depression, hallucinations, memory loss, substance abuse and suicidal ideas. The patient is not nervous/anxious and does not have insomnia.        Positive for mild anxiety     Past Medical History:  Past Medical History:  Diagnosis Date  . Anxiety   . Depression   . Migraines   . PONV (postoperative nausea and vomiting)     Past Surgical History:  Past Surgical History:  Procedure Laterality Date  . ADENOIDECTOMY    . ELBOW SURGERY    . KNEE SURGERY    . TONSILLECTOMY      Family History:  Family History  Problem Relation Age of Onset  . Diabetes type II Paternal Grandfather   . Ovarian cancer Maternal Grandmother   . Migraines Maternal Grandmother   . Breast cancer Paternal Grandmother 63  . Ulcers Mother   . Fibromyalgia Mother   . Migraines Mother   . Drug abuse Mother   . Thyroid disease Father   . Alcohol abuse Father   . Migraines Sister   . Turner syndrome Maternal Aunt   . Crohn's disease Maternal Aunt   . Stroke Maternal Aunt   . Arthritis Maternal Aunt     Social History:  Social History   Socioeconomic History  . Marital status: Married    Spouse name: [name]   . Number of children: Not on file  . Years of education: Not on file  . Highest education level: Not on file  Occupational History  . Not on file  Tobacco Use  .  Smoking status: Passive Smoke Exposure - Never Smoker  . Smokeless tobacco: Never Used  Vaping Use  . Vaping Use: Never used  Substance and Sexual Activity  . Alcohol use: No  . Drug use: Yes    Types: Marijuana    Comment: Last use 04/29/20  . Sexual activity: Yes    Birth control/protection: None  Other Topics Concern  . Not on file  Social History Narrative  . Not on file    Social Determinants of Health   Financial Resource Strain: Not on file  Food Insecurity: Not on file  Transportation Needs: Not on file  Physical Activity: Not on file  Stress: Not on file  Social Connections: Not on file  Intimate Partner Violence: Not on file    Allergies:  Allergies  Allergen Reactions  . Amoxicillin Hives  . Augmentin [Amoxicillin-Pot Clavulanate] Rash  . Codeine Nausea And Vomiting  . Flexeril [Cyclobenzaprine] Tinitus    Agitation and disorientation, muscle cramps  . Tylenol With Codeine #3 [Acetaminophen-Codeine] Other (See Comments)    emesis    Medications: Prior to Admission medications   Medication Sig Start Date End Date Taking? Authorizing Provider  ibuprofen (ADVIL) 600 MG tablet Take 1 tablet (600 mg total) by mouth every 6 (six) hours. Patient not taking: Reported on 05/31/2020 05/18/20   Mirna Mires, CNM  rizatriptan (MAXALT) 5 MG tablet Take by mouth. Patient not taking: Reported on 05/31/2020 05/25/20   [provider]    Physical Exam Blood pressure 110/70, height 5\' 3"  (1.6 m), weight 130 lb 12.8 oz (59.3 kg), last menstrual period 08/26/2019    General: NAD HEENT: normocephalic, anicteric Pulmonary: No increased work of breathing Extremities: no edema, erythema, or tenderness Neurologic: Grossly intact Psychiatric: mood appropriate, affect full   Edinburgh Postnatal Depression Scale - 06/02/20 1021      Edinburgh Postnatal Depression Scale:  In the Past 7 Days   I have been able to laugh and see the funny side of things. 0    I have looked forward with enjoyment to things. 0    I have blamed myself unnecessarily when things went wrong. 3    I have been anxious or worried for no good reason. 1   I have felt scared or panicky for no good reason. 1    Things have been getting on top of me. 1    I have been so unhappy that I have had difficulty sleeping. 0    I have felt sad or miserable. 0    I have been so  unhappy that I have been crying. 0    The thought of harming myself has occurred to me. 0    Edinburgh Postnatal Depression Scale Total 6           Assessment: 20 y.o. 26 presenting for 2 week postpartum mood check visit  Plan: Problem List Items Addressed This Visit   None     1) Contraception - Education given regarding options for contraception, as well as compatibility with breast feeding if applicable.  Patient plans on none for contraception.  2)  Pap - ASCCP guidelines and rationale discussed.  ASCCP guidelines and rational discussed.  Patient opts for begin at age 80 screening interval  3) Patient underwent screening for postpartum depression with no concerning signs of depression  4) Return in about 4 weeks (around 06/30/2020) for 6 wk postpartum.   08/30/2020, CNM [facility] 06/02/2020, 10:36 AM

## 2020-06-03 ENCOUNTER — Telehealth: Payer: Self-pay | Admitting: *Deleted

## 2020-06-03 NOTE — Telephone Encounter (Signed)
She needs to speak to her gyn. Her liver functions were normal when we checked 3 days ago

## 2020-06-03 NOTE — Telephone Encounter (Signed)
Patient called stating that she had a bunch of labs drawn the other day and that she has an appointment to come in to discuss results, but she is having symptoms of Cholestasis again citing that she had severe itching of her hands and feet yesterday. She is asking if she needs to have additional labs drawn and if this is something we would do or if she needs to see some one else for this. Please advise

## 2020-06-03 NOTE — Telephone Encounter (Signed)
Call returned to patient and informed of doctor response. She stated she would call her GYN

## 2020-06-10 ENCOUNTER — Encounter: Payer: Self-pay | Admitting: Oncology

## 2020-06-10 ENCOUNTER — Inpatient Hospital Stay (HOSPITAL_BASED_OUTPATIENT_CLINIC_OR_DEPARTMENT_OTHER): Payer: Medicaid Other | Admitting: Oncology

## 2020-06-10 DIAGNOSIS — E538 Deficiency of other specified B group vitamins: Secondary | ICD-10-CM | POA: Diagnosis not present

## 2020-06-10 DIAGNOSIS — D508 Other iron deficiency anemias: Secondary | ICD-10-CM | POA: Diagnosis not present

## 2020-06-10 DIAGNOSIS — D509 Iron deficiency anemia, unspecified: Secondary | ICD-10-CM | POA: Insufficient documentation

## 2020-06-12 NOTE — Progress Notes (Signed)
I connected with Adriana Kerr on 06/12/20 at  2:15 PM EDT by video enabled telemedicine visit and verified that I am speaking with the correct person using two identifiers.   I discussed the limitations, risks, security and privacy concerns of performing an evaluation and management service by telemedicine and the availability of in-person appointments. I also discussed with the patient that there may be a patient responsible charge related to this service. The patient expressed understanding and agreed to proceed.  Other persons participating in the visit and their role in the encounter:  none  Patient's location:  home Provider's location:  work  Stage manager Complaint:  Discuss results of bloodwork  History of present illness: patient is a 20 year old female currently 1 month postpartum after the delivery of her first child.  Pregnancy was complicated by cholestasisBut delivery was uneventful.  Patient has been referred to [place] for anemia.  Patient's hemoglobin was stable between 11-12 up until January 2022 and then it drifted down to 10 in February and March 2022.  She delivered on 05/17/2020 and hemoglobin was 7.4 on 3/29.  During her pregnancy patient had significant nausea and vomiting and was not able to eat much.  Presently patient reports feeling a whole lot better.  She has tried to take oral iron tablets in the past but reports having significant abdominal pain.  Currently she reports fatigue when she walks more than a block.  Denies any blood loss in her stool.   Results of blood work from 05/31/2020 were as follows: CBC showed white count of 4.4, H&H of 9.8/31.6 with an MCV of 83.8 and a platelet count of 413.  B12 levels were low at 278.  Ferritin level low at 5 with an elevated TIBC of 616.  Folate was normal at 9.1.  Myeloma panel showed no M protein.  Haptoglobin and TSH normal.   Interval history patient reports some ongoing mild fatigue but denies other complaints at this time   Review  of Systems  Constitutional: Positive for malaise/fatigue. Negative for chills, fever and weight loss.  HENT: Negative for congestion, ear discharge and nosebleeds.   Eyes: Negative for blurred vision.  Respiratory: Negative for cough, hemoptysis, sputum production, shortness of breath and wheezing.   Cardiovascular: Negative for chest pain, palpitations, orthopnea and claudication.  Gastrointestinal: Negative for abdominal pain, blood in stool, constipation, diarrhea, heartburn, melena, nausea and vomiting.  Genitourinary: Negative for dysuria, flank pain, frequency, hematuria and urgency.  Musculoskeletal: Negative for back pain, joint pain and myalgias.  Skin: Negative for rash.  Neurological: Negative for dizziness, tingling, focal weakness, seizures, weakness and headaches.  Endo/Heme/Allergies: Does not bruise/bleed easily.  Psychiatric/Behavioral: Negative for depression and suicidal ideas. The patient does not have insomnia.     Allergies  Allergen Reactions  . Amoxicillin Hives  . Augmentin [Amoxicillin-Pot Clavulanate] Rash  . Codeine Nausea And Vomiting  . Flexeril [Cyclobenzaprine] Tinitus    Agitation and disorientation, muscle cramps  . Tylenol With Codeine #3 [Acetaminophen-Codeine] Other (See Comments)    emesis    Past Medical History:  Diagnosis Date  . Anxiety   . Depression   . Migraines   . PONV (postoperative nausea and vomiting)     Past Surgical History:  Procedure Laterality Date  . ADENOIDECTOMY    . ELBOW SURGERY    . KNEE SURGERY    . TONSILLECTOMY      Social History   Socioeconomic History  . Marital status: Married    Spouse name: [name]   .  Number of children: Not on file  . Years of education: Not on file  . Highest education level: Not on file  Occupational History  . Not on file  Tobacco Use  . Smoking status: Passive Smoke Exposure - Never Smoker  . Smokeless tobacco: Never Used  Vaping Use  . Vaping Use: Never used  Substance  and Sexual Activity  . Alcohol use: No  . Drug use: Yes    Types: Marijuana    Comment: Last use 04/29/20  . Sexual activity: Yes    Birth control/protection: None  Other Topics Concern  . Not on file  Social History Narrative  . Not on file   Social Determinants of Health   Financial Resource Strain: Not on file  Food Insecurity: Not on file  Transportation Needs: Not on file  Physical Activity: Not on file  Stress: Not on file  Social Connections: Not on file  Intimate Partner Violence: Not on file    Family History  Problem Relation Age of Onset  . Diabetes type II Paternal Grandfather   . Ovarian cancer Maternal Grandmother   . Migraines Maternal Grandmother   . Breast cancer Paternal Grandmother 42  . Ulcers Mother   . Fibromyalgia Mother   . Migraines Mother   . Drug abuse Mother   . Thyroid disease Father   . Alcohol abuse Father   . Migraines Sister   . Turner syndrome Maternal Aunt   . Crohn's disease Maternal Aunt   . Stroke Maternal Aunt   . Arthritis Maternal Aunt      Current Outpatient Medications:  .  ibuprofen (ADVIL) 600 MG tablet, Take 1 tablet (600 mg total) by mouth every 6 (six) hours., Disp: 30 tablet, Rfl: 0 .  rizatriptan (MAXALT) 5 MG tablet, Take by mouth. (Patient not taking: No sig reported), Disp: , Rfl:   [place] MFM OB LIMITED  Result Date: 05/13/2020 ----------------------------------------------------------------------  OBSTETRICS REPORT                       (Signed Final 05/13/2020 01:08 pm) ---------------------------------------------------------------------- Patient Info  ID #:       [id number]                          D.O.B.:  02-01-2001 (19 yrs)  Name:       Adriana Kerr [facility]               Visit Date: 05/13/2020 12:51 pm ---------------------------------------------------------------------- Performed By  Attending:        Lin Landsman      Referred By:      Jackelyn Poling                    MD                                       FRYER   Performed By:     Criselda Peaches         [facility]                                                             [facility] ---------------------------------------------------------------------- Orders  #  Description                           Code        Ordered By  1  [place] MFM OB LIMITED                     12751.70    Thomasene Mohair ----------------------------------------------------------------------  #  Order #                     Accession #                Episode #  1  [id number]                   [id number]                 [id number] ---------------------------------------------------------------------- Indications  Cholestasis of pregnancy, third trimester      B93.903E09.2  [redacted] weeks gestation of pregnancy                Z3A.36 ---------------------------------------------------------------------- Fetal Evaluation  Num Of Fetuses:         1  Fetal Heart Rate(bpm):  153  Cardiac Activity:       Observed  Presentation:           Cephalic  Placenta:               Anterior  AFI Sum(cm)     %Tile       Largest Pocket(cm)  22.4            86          9.7  RUQ(cm)       RLQ(cm)       LUQ(cm)        LLQ(cm)  9.7           6.2           2.1            4.4 ---------------------------------------------------------------------- OB History  Gravidity:    3         Term:   0        Prem:   0        SAB:   2  TOP:          0       Ectopic:  0        Living: 0 ---------------------------------------------------------------------- Gestational Age  Best:          36w 4d     Det. By:  Marcella Dubs         EDD:   06/06/20                                      (10/14/19) ---------------------------------------------------------------------- Impression  Limited exam to assess amniotic fluid for cholestasis in  pregnancy  Good fetal movement and amniotic fluid volume.  ---------------------------------------------------------------------- Recommendations  Planned delivery on  Sunday 05/16/20 ----------------------------------------------------------------------               Lin Landsmanorenthian Booker, MD Electronically Signed Final Report   05/13/2020 01:08 pm ----------------------------------------------------------------------   No images are attached to the encounter.   CMP Latest Ref Rng & Units 05/31/2020  Glucose 70 - 99 mg/dL 81  BUN 6 - 20 mg/dL 16(X23(H)  Creatinine 0.960.44 - 1.00 mg/dL 0.450.76  Sodium 409135 - 811145 mmol/L 138  Potassium 3.5 - 5.1 mmol/L 3.6  Chloride 98 - 111 mmol/L 104  CO2 22 - 32 mmol/L 24  Calcium 8.9 - 10.3 mg/dL 9.3  Total Protein 6.5 - 8.1 g/dL 7.7  Total Bilirubin 0.3 - 1.2 mg/dL 0.5  Alkaline Phos 38 - 126 U/L 126  AST 15 - 41 U/L 20  ALT 0 - 44 U/L 17   CBC Latest Ref Rng & Units 05/31/2020  WBC 4.0 - 10.5 K/uL 4.4  Hemoglobin 12.0 - 15.0 g/dL 9.1(Y9.8(L)  Hematocrit 78.236.0 - 46.0 % 31.6(L)  Platelets 150 - 400 K/uL 413(H)     Observation/objective: Appears in no acute distress over video visit today.  Breathing is nonlabored  Assessment and plan: Patient is a 20 year old female referred for postpartum anemia  Discussed results of blood work with the patient which was consistent with iron deficiency as well as mild B12 deficiency.Her anemia is improving as compared to what she was right after delivery.  But she is still anemic.  Recommend 2 doses of Feraheme 510 mg IV weekly along with 2 doses of B12.  Discussed risks and benefits of Feraheme including all but not limited to headache, nausea and possible risk of infusion reaction.  Patient understands and agrees to proceed as planned.  I will see her back in 3 months with CBC ferritin and iron studies  Follow-up instructions:as above  I discussed the assessment and treatment plan with the patient. The patient was provided an opportunity to ask questions and all were answered. The patient  agreed with the plan and demonstrated an understanding of the instructions.   The patient was advised to call back or seek an in-person evaluation if the symptoms worsen or if the condition fails to improve as anticipated.    Visit Diagnosis: 1. B12 deficiency   2. Other iron deficiency anemia     Dr. Owens SharkArchana Sayed Apostol, MD, MPH [facility] Tel- [phone] 06/12/2020 8:30 AM;

## 2020-06-15 ENCOUNTER — Inpatient Hospital Stay: Payer: Medicaid Other

## 2020-06-16 ENCOUNTER — Inpatient Hospital Stay: Payer: Medicaid Other

## 2020-06-16 VITALS — BP 100/62 | HR 65 | Temp 97.5°F | Resp 16

## 2020-06-16 DIAGNOSIS — D508 Other iron deficiency anemias: Secondary | ICD-10-CM

## 2020-06-16 DIAGNOSIS — R110 Nausea: Secondary | ICD-10-CM

## 2020-06-16 DIAGNOSIS — D649 Anemia, unspecified: Secondary | ICD-10-CM | POA: Diagnosis not present

## 2020-06-16 MED ORDER — ONDANSETRON HCL 4 MG PO TABS
4.0000 mg | ORAL_TABLET | Freq: Once | ORAL | Status: AC
  Administered 2020-06-16: 4 mg via ORAL
  Filled 2020-06-16: qty 1

## 2020-06-16 MED ORDER — SODIUM CHLORIDE 0.9 % IV SOLN
510.0000 mg | INTRAVENOUS | Status: DC
  Administered 2020-06-16: 510 mg via INTRAVENOUS
  Filled 2020-06-16: qty 510

## 2020-06-16 MED ORDER — CYANOCOBALAMIN 1000 MCG/ML IJ SOLN
1000.0000 ug | INTRAMUSCULAR | Status: DC
  Administered 2020-06-16: 1000 ug via INTRAMUSCULAR
  Filled 2020-06-16: qty 1

## 2020-06-16 MED ORDER — SODIUM CHLORIDE 0.9 % IV SOLN
Freq: Once | INTRAVENOUS | Status: AC
  Filled 2020-06-16: qty 250

## 2020-06-16 NOTE — Patient Instructions (Signed)
Cyanocobalamin, Vitamin B12 injection What is this medicine? CYANOCOBALAMIN (sye an oh koe BAL a min) is a man made form of vitamin B12. Vitamin B12 is used in the growth of healthy blood cells, nerve cells, and proteins in the body. It also helps with the metabolism of fats and carbohydrates. This medicine is used to treat people who can not absorb vitamin B12. This medicine may be used for other purposes; ask your health care provider or pharmacist if you have questions. COMMON BRAND NAME(S): B-12 Compliance Kit, B-12 Injection Kit, Cyomin, LA-12, Nutri-Twelve, Physicians EZ Use B-12, Primabalt What should I tell my health care provider before I take this medicine? They need to know if you have any of these conditions:  kidney disease  Leber's disease  megaloblastic anemia  an unusual or allergic reaction to cyanocobalamin, cobalt, other medicines, foods, dyes, or preservatives  pregnant or trying to get pregnant  breast-feeding How should I use this medicine? This medicine is injected into a muscle or deeply under the skin. It is usually given by a health care professional in a clinic or doctor's office. However, your doctor may teach you how to inject yourself. Follow all instructions. Talk to your pediatrician regarding the use of this medicine in children. Special care may be needed. Overdosage: If you think you have taken too much of this medicine contact a poison control center or emergency room at once. NOTE: This medicine is only for you. Do not share this medicine with others. What if I miss a dose? If you are given your dose at a clinic or doctor's office, call to reschedule your appointment. If you give your own injections and you miss a dose, take it as soon as you can. If it is almost time for your next dose, take only that dose. Do not take double or extra doses. What may interact with this medicine?  colchicine  heavy alcohol intake This list may not describe all  possible interactions. Give your health care provider a list of all the medicines, herbs, non-prescription drugs, or dietary supplements you use. Also tell them if you smoke, drink alcohol, or use illegal drugs. Some items may interact with your medicine. What should I watch for while using this medicine? Visit your doctor or health care professional regularly. You may need blood work done while you are taking this medicine. You may need to follow a special diet. Talk to your doctor. Limit your alcohol intake and avoid smoking to get the best benefit. What side effects may I notice from receiving this medicine? Side effects that you should report to your doctor or health care professional as soon as possible:  allergic reactions like skin rash, itching or hives, swelling of the face, lips, or tongue  blue tint to skin  chest tightness, pain  difficulty breathing, wheezing  dizziness  red, swollen painful area on the leg Side effects that usually do not require medical attention (report to your doctor or health care professional if they continue or are bothersome):  diarrhea  headache This list may not describe all possible side effects. Call your doctor for medical advice about side effects. You may report side effects to FDA at 1-800-FDA-1088. Where should I keep my medicine? Keep out of the reach of children. Store at room temperature between 15 and 30 degrees C (59 and 85 degrees F). Protect from light. Throw away any unused medicine after the expiration date. NOTE: This sheet is a summary. It may not cover  all possible information. If you have questions about this medicine, talk to your doctor, pharmacist, or health care provider.  2021 Elsevier/Gold Standard (2007-05-20 22:10:20) Ferumoxytol injection What is this medicine? FERUMOXYTOL is an iron complex. Iron is used to make healthy red blood cells, which carry oxygen and nutrients throughout the body. This medicine is used to  treat iron deficiency anemia. This medicine may be used for other purposes; ask your health care provider or pharmacist if you have questions. COMMON BRAND NAME(S): Feraheme What should I tell my health care provider before I take this medicine? They need to know if you have any of these conditions:  anemia not caused by low iron levels  high levels of iron in the blood  magnetic resonance imaging (MRI) test scheduled  an unusual or allergic reaction to iron, other medicines, foods, dyes, or preservatives  pregnant or trying to get pregnant  breast-feeding How should I use this medicine? This medicine is for injection into a vein. It is given by a health care professional in a hospital or clinic setting. Talk to your pediatrician regarding the use of this medicine in children. Special care may be needed. Overdosage: If you think you have taken too much of this medicine contact a poison control center or emergency room at once. NOTE: This medicine is only for you. Do not share this medicine with others. What if I miss a dose? It is important not to miss your dose. Call your doctor or health care professional if you are unable to keep an appointment. What may interact with this medicine? This medicine may interact with the following medications:  other iron products This list may not describe all possible interactions. Give your health care provider a list of all the medicines, herbs, non-prescription drugs, or dietary supplements you use. Also tell them if you smoke, drink alcohol, or use illegal drugs. Some items may interact with your medicine. What should I watch for while using this medicine? Visit your doctor or healthcare professional regularly. Tell your doctor or healthcare professional if your symptoms do not start to get better or if they get worse. You may need blood work done while you are taking this medicine. You may need to follow a special diet. Talk to your doctor.  Foods that contain iron include: whole grains/cereals, dried fruits, beans, or peas, leafy green vegetables, and organ meats (liver, kidney). What side effects may I notice from receiving this medicine? Side effects that you should report to your doctor or health care professional as soon as possible:  allergic reactions like skin rash, itching or hives, swelling of the face, lips, or tongue  breathing problems  changes in blood pressure  feeling faint or lightheaded, falls  fever or chills  flushing, sweating, or hot feelings  swelling of the ankles or feet Side effects that usually do not require medical attention (report to your doctor or health care professional if they continue or are bothersome):  diarrhea  headache  nausea, vomiting  stomach pain This list may not describe all possible side effects. Call your doctor for medical advice about side effects. You may report side effects to FDA at 1-800-FDA-1088. Where should I keep my medicine? This drug is given in a hospital or clinic and will not be stored at home. NOTE: This sheet is a summary. It may not cover all possible information. If you have questions about this medicine, talk to your doctor, pharmacist, or health care provider.  2021 Elsevier/Gold Standard (  2016-03-27 20:21:10)  

## 2020-06-16 NOTE — Progress Notes (Signed)
Patient monitored x 30 minutes post first time Feraheme infusion. Patient tolerated well and denies any further questions or concerns. Discharged home.

## 2020-06-21 ENCOUNTER — Ambulatory Visit (INDEPENDENT_AMBULATORY_CARE_PROVIDER_SITE_OTHER): Payer: Medicaid Other | Admitting: Obstetrics and Gynecology

## 2020-06-21 ENCOUNTER — Other Ambulatory Visit: Payer: Self-pay

## 2020-06-21 ENCOUNTER — Encounter: Payer: Self-pay | Admitting: Obstetrics and Gynecology

## 2020-06-21 VITALS — BP 122/74 | Wt 132.0 lb

## 2020-06-21 DIAGNOSIS — R1013 Epigastric pain: Secondary | ICD-10-CM | POA: Diagnosis not present

## 2020-06-21 DIAGNOSIS — O99893 Other specified diseases and conditions complicating puerperium: Secondary | ICD-10-CM | POA: Diagnosis not present

## 2020-06-21 NOTE — Progress Notes (Signed)
Obstetrics & Gynecology Office Visit   Chief Complaint  Patient presents with  . Postpartum Care    History of Present Illness: 20 y.o. G23P1021 female who is 4 weeks postpartum from an SVD, pregnancy complicated by ICP.  For the past week or so she has been having epigastric and slightly right upper quadrant pain that comes in bursts.  The pain is intermittent.  However, it can be more present throughout the day.  The pain is described as sharp and stabbing.  The pain does not radiate.  Again, it lasts for a few seconds at a time.  Nothing seems to bring this pain on that she can identify.  She notes no alleviating or aggravating factors.  Associated symptoms; nausea and feeling hot.  More pertinently to her exam today is feeling a bulge in her vaginal area and lots of pressure while this pain is present.  When the pain is not present the bulge is not present.  She has not had the pain today.  However, yesterday the pain was present nearly throughout the day off and on.  She is receiving iron infusions.  She received one last Wednesday.  She is not taking anything for contraception at this time. Denies hematochezia and melena.  Denies hematuria.  She has had no vaginal bleeding or abnormal discharge or any vaginal irritative symptoms.  Past Medical History:  Diagnosis Date  . Anxiety   . Depression   . Migraines   . PONV (postoperative nausea and vomiting)     Past Surgical History:  Procedure Laterality Date  . ADENOIDECTOMY    . ELBOW SURGERY    . KNEE SURGERY    . TONSILLECTOMY      Gynecologic History: Patient's last menstrual period was 08/26/2019.  Obstetric History: [id number]  Family History  Problem Relation Age of Onset  . Diabetes type II Paternal Grandfather   . Ovarian cancer Maternal Grandmother   . Migraines Maternal Grandmother   . Breast cancer Paternal Grandmother 50  . Ulcers Mother   . Fibromyalgia Mother   . Migraines Mother   . Drug abuse Mother   .  Thyroid disease Father   . Alcohol abuse Father   . Migraines Sister   . Turner syndrome Maternal Aunt   . Crohn's disease Maternal Aunt   . Stroke Maternal Aunt   . Arthritis Maternal Aunt     Social History   Socioeconomic History  . Marital status: Married    Spouse name: [name]   . Number of children: Not on file  . Years of education: Not on file  . Highest education level: Not on file  Occupational History  . Not on file  Tobacco Use  . Smoking status: Passive Smoke Exposure - Never Smoker  . Smokeless tobacco: Never Used  Vaping Use  . Vaping Use: Never used  Substance and Sexual Activity  . Alcohol use: No  . Drug use: Yes    Types: Marijuana    Comment: Last use 04/29/20  . Sexual activity: Yes    Birth control/protection: None  Other Topics Concern  . Not on file  Social History Narrative  . Not on file   Social Determinants of Health   Financial Resource Strain: Not on file  Food Insecurity: Not on file  Transportation Needs: Not on file  Physical Activity: Not on file  Stress: Not on file  Social Connections: Not on file  Intimate Partner Violence: Not on file    Allergies  Allergen Reactions  . Amoxicillin Hives  . Augmentin [Amoxicillin-Pot Clavulanate] Rash  . Codeine Nausea And Vomiting  . Flexeril [Cyclobenzaprine] Tinitus    Agitation and disorientation, muscle cramps  . Tylenol With Codeine #3 [Acetaminophen-Codeine] Other (See Comments)    emesis    Prior to Admission medications   Medication Sig Start Date End Date Taking? Authorizing Provider  ibuprofen (ADVIL) 600 MG tablet Take 1 tablet (600 mg total) by mouth every 6 (six) hours. 05/18/20   Mirna Mires, CNM  rizatriptan (MAXALT) 5 MG tablet Take by mouth. Patient not taking: No sig reported 05/25/20   [provider]    Review of Systems  Constitutional: Negative.   HENT: Negative.   Eyes: Negative.   Respiratory: Negative.   Cardiovascular: Negative.    Gastrointestinal: Positive for abdominal pain and nausea. Negative for blood in stool, constipation, diarrhea, heartburn, melena and vomiting.  Genitourinary: Negative.        See HPI  Musculoskeletal: Negative.   Skin: Negative.   Neurological: Negative.   Psychiatric/Behavioral: Negative.      Physical Exam BP 122/74   Wt 132 lb (59.9 kg)   LMP 08/26/2019   BMI 23.38 kg/m  Patient's last menstrual period was 08/26/2019. Physical Exam Constitutional:      General: She is not in acute distress.    Appearance: Normal appearance. She is well-developed.  Genitourinary:     Vulva, bladder and urethral meatus normal.     No lesions in the vagina.     Right Labia: No rash, tenderness, lesions, skin changes or Bartholin's cyst.    Left Labia: No tenderness, lesions, skin changes, Bartholin's cyst or rash.    No inguinal adenopathy present in the right or left side.    Pelvic Tanner Score: 5/5.    No vaginal discharge, erythema, tenderness, bleeding or ulceration.     No vaginal prolapse present.    No vaginal atrophy present.     Right Adnexa: not tender, not full and no mass present.    Left Adnexa: not tender, not full and no mass present.    No cervical motion tenderness, discharge, friability, lesion, polyp or nabothian cyst.     Uterus is not enlarged, fixed or tender.     Uterus is anteverted.     No urethral tenderness or mass present.     Pelvic exam was performed with patient in the lithotomy position.  HENT:     Head: Normocephalic and atraumatic.  Eyes:     General: No scleral icterus.    Conjunctiva/sclera: Conjunctivae normal.  Cardiovascular:     Rate and Rhythm: Normal rate and regular rhythm.     Heart sounds: No murmur heard. No friction rub. No gallop.   Pulmonary:     Effort: Pulmonary effort is normal. No respiratory distress.     Breath sounds: Normal breath sounds. No wheezing or rales.  Abdominal:     General: Bowel sounds are normal. There is no  distension.     Palpations: Abdomen is soft. There is no mass.     Tenderness: There is no abdominal tenderness. There is no guarding or rebound.     Hernia: There is no hernia in the left inguinal area or right inguinal area.     Comments: No diastasis recti  Musculoskeletal:        General: Normal range of motion.     Cervical back: Normal range of motion and neck supple.  Lymphadenopathy:  Lower Body: No right inguinal adenopathy. No left inguinal adenopathy.  Neurological:     General: No focal deficit present.     Mental Status: She is alert and oriented to person, place, and time.     Cranial Nerves: No cranial nerve deficit.  Skin:    General: Skin is warm and dry.     Findings: No erythema.  Psychiatric:        Mood and Affect: Mood normal.        Behavior: Behavior normal.        Judgment: Judgment normal.     Female chaperone present for pelvic and breast  portions of the physical exam  Assessment: 20 y.o. G8Q7619 female here for  1. Epigastric abdominal pain      Plan: Problem List Items Addressed This Visit   None   Visit Diagnoses    Epigastric abdominal pain    -  Primary     Overall, the picture is not clear as to what is causing her pain and simultaneous vaginal pressure and bulging.  There is no clear link in the areas from an anatomic standpoint.  There may be a downstream vascular issue if the original issue is vascular in nature.  We discussed talking with her hematologist about whether iron infusions would cause a sort of side effect, even though she states she had her first symptoms prior to her first iron infusion.  We will monitor her symptoms for a few more days.  If they continue or worsen, will get labs and will need to do some abdominal imaging.  Reassured her that her vaginal and vulvar anatomy appears very normal at this time.  If there is a consistent and concerning vaginal or vulvar issue, this is likely to be present even without her  epigastric pain.  We will continue to monitor.  A total of 23 minutes were spent face-to-face with the patient as well as preparation, review, communication, and documentation during this encounter.    Thomasene Mohair, MD 06/21/2020 9:45 AM

## 2020-06-22 ENCOUNTER — Inpatient Hospital Stay: Payer: Medicaid Other

## 2020-06-23 ENCOUNTER — Other Ambulatory Visit: Payer: Self-pay

## 2020-06-23 ENCOUNTER — Inpatient Hospital Stay: Payer: Medicaid Other | Attending: Oncology

## 2020-06-23 VITALS — BP 106/69 | HR 61 | Temp 98.2°F | Resp 16

## 2020-06-23 DIAGNOSIS — E538 Deficiency of other specified B group vitamins: Secondary | ICD-10-CM | POA: Insufficient documentation

## 2020-06-23 DIAGNOSIS — D508 Other iron deficiency anemias: Secondary | ICD-10-CM | POA: Insufficient documentation

## 2020-06-23 MED ORDER — SODIUM CHLORIDE 0.9 % IV SOLN
510.0000 mg | INTRAVENOUS | Status: DC
  Administered 2020-06-23: 510 mg via INTRAVENOUS
  Filled 2020-06-23: qty 510

## 2020-06-23 MED ORDER — SODIUM CHLORIDE 0.9 % IV SOLN
Freq: Once | INTRAVENOUS | Status: AC
  Filled 2020-06-23: qty 250

## 2020-06-23 MED ORDER — CYANOCOBALAMIN 1000 MCG/ML IJ SOLN
1000.0000 ug | INTRAMUSCULAR | Status: DC
  Administered 2020-06-23: 1000 ug via INTRAMUSCULAR
  Filled 2020-06-23: qty 1

## 2020-06-23 NOTE — Progress Notes (Signed)
Per Dr. Smith Robert, patient is to start taking Vitamin B12 1000 MCG PO daily. Patient educated on this and advised to start tomorrow. Patient verbalizes understanding and denies any further questions or concerns.   As I was discharging patient, patient stated that she started having epigastric pain on and off since 06/18/20 (2 days after her first iron infusion). She states that she made her OBGYN aware as well since she is 6 weeks postpartum. Patient denies any pain in clinic today. I informed patient that it was unlikely to be caused from the iron infusion but to monitor s/s and to reach out to the clinic should she notice the symptoms after second infusion. Patient verbalizes understanding and denies any further questions or concerns. Dr. Smith Robert made aware.

## 2020-06-29 ENCOUNTER — Other Ambulatory Visit: Payer: Self-pay

## 2020-06-29 ENCOUNTER — Ambulatory Visit: Payer: Medicaid Other | Admitting: Advanced Practice Midwife

## 2020-06-29 ENCOUNTER — Encounter: Payer: Self-pay | Admitting: Obstetrics and Gynecology

## 2020-06-29 ENCOUNTER — Ambulatory Visit (INDEPENDENT_AMBULATORY_CARE_PROVIDER_SITE_OTHER): Payer: Medicaid Other | Admitting: Obstetrics and Gynecology

## 2020-06-29 NOTE — Progress Notes (Signed)
Postpartum Visit   Chief Complaint  Patient presents with  . Postpartum Care    History of Present Illness: Patient is a 20 y.o. [id number] presents for postpartum visit.  Date of delivery: 05/17/2020 Type of delivery: Vaginal delivery - Vacuum or forceps assisted no Episiotomy No.  Laceration: no Pregnancy or labor problems:  Cholestasis  Any problems since the delivery:  Some abdominal pain, migraines  Newborn Details:  SINGLETON :  1. Baby's name: Leatrice Jewels. Birth weight: 6.4lb Maternal Details:  Breast Feeding:  Form[place] Post partum depression/anxiety noted:  no Edinburgh Post-Partum Depression Score:  7  Date of last PAP: has not had pap d/t age  Past Medical History:  Diagnosis Date  . Anxiety   . Depression   . Migraines   . PONV (postoperative nausea and vomiting)     Past Surgical History:  Procedure Laterality Date  . ADENOIDECTOMY    . ELBOW SURGERY    . KNEE SURGERY    . TONSILLECTOMY      Prior to Admission medications   Medication Sig Start Date End Date Taking? Authorizing Provider  ibuprofen (ADVIL) 600 MG tablet Take 1 tablet (600 mg total) by mouth every 6 (six) hours. Patient not taking: Reported on 06/29/2020 05/18/20   Mirna Mires, CNM  rizatriptan (MAXALT) 5 MG tablet Take by mouth. Patient not taking: No sig reported 05/25/20   [provider]    Allergies  Allergen Reactions  . Amoxicillin Hives  . Augmentin [Amoxicillin-Pot Clavulanate] Rash  . Codeine Nausea And Vomiting  . Flexeril [Cyclobenzaprine] Tinitus    Agitation and disorientation, muscle cramps  . Tylenol With Codeine #3 [Acetaminophen-Codeine] Other (See Comments)    emesis     Social History   Socioeconomic History  . Marital status: Married    Spouse name: [name]   . Number of children: Not on file  . Years of education: Not on file  . Highest education level: Not on file  Occupational History  . Not on file  Tobacco Use  . Smoking status: Passive Smoke  Exposure - Never Smoker  . Smokeless tobacco: Never Used  Vaping Use  . Vaping Use: Never used  Substance and Sexual Activity  . Alcohol use: No  . Drug use: Yes    Types: Marijuana    Comment: Last use 04/29/20  . Sexual activity: Yes    Birth control/protection: None  Other Topics Concern  . Not on file  Social History Narrative  . Not on file   Social Determinants of Health   Financial Resource Strain: Not on file  Food Insecurity: Not on file  Transportation Needs: Not on file  Physical Activity: Not on file  Stress: Not on file  Social Connections: Not on file  Intimate Partner Violence: Not on file    Family History  Problem Relation Age of Onset  . Diabetes type II Paternal Grandfather   . Ovarian cancer Maternal Grandmother   . Migraines Maternal Grandmother   . Breast cancer Paternal Grandmother 92  . Ulcers Mother   . Fibromyalgia Mother   . Migraines Mother   . Drug abuse Mother   . Thyroid disease Father   . Alcohol abuse Father   . Migraines Sister   . Turner syndrome Maternal Aunt   . Crohn's disease Maternal Aunt   . Stroke Maternal Aunt   . Arthritis Maternal Aunt     Review of Systems  Constitutional: Negative.   HENT: Negative.   Eyes:  Negative.   Respiratory: Negative.   Cardiovascular: Negative.   Gastrointestinal: Negative.   Genitourinary: Negative.   Musculoskeletal: Negative.   Skin: Negative.   Neurological: Negative.   Psychiatric/Behavioral: Negative.      Physical Exam BP 126/84   Ht 5\' 4"  (1.626 m)   Wt 128 lb (58.1 kg)   LMP 08/26/2019   BMI 21.97 kg/m   Physical Exam Constitutional:      General: She is not in acute distress.    Appearance: Normal appearance. She is well-developed.  Genitourinary:     Vulva, bladder and urethral meatus normal.     Right Labia: No rash, tenderness, lesions, skin changes or Bartholin's cyst.    Left Labia: No tenderness, skin changes, Bartholin's cyst or rash.    No inguinal  adenopathy present in the right or left side.    Pelvic Tanner Score: 5/5.     Right Adnexa: not tender, not full and no mass present.    Left Adnexa: not tender, not full and no mass present.    No cervical motion tenderness, friability, lesion or polyp.     Uterus is not enlarged, fixed or tender.     Uterus is anteverted.     No urethral tenderness or mass present.     Pelvic exam was performed with patient in the lithotomy position.  HENT:     Head: Normocephalic and atraumatic.  Eyes:     General: No scleral icterus.    Conjunctiva/sclera: Conjunctivae normal.  Cardiovascular:     Rate and Rhythm: Normal rate and regular rhythm.     Heart sounds: No murmur heard. No friction rub. No gallop.   Pulmonary:     Effort: Pulmonary effort is normal. No respiratory distress.     Breath sounds: Normal breath sounds. No wheezing or rales.  Abdominal:     General: Bowel sounds are normal. There is no distension.     Palpations: Abdomen is soft. There is no mass.     Tenderness: There is no abdominal tenderness. There is no guarding or rebound.     Hernia: There is no hernia in the left inguinal area or right inguinal area.  Musculoskeletal:        General: Normal range of motion.     Cervical back: Normal range of motion and neck supple.  Lymphadenopathy:     Lower Body: No right inguinal adenopathy. No left inguinal adenopathy.  Neurological:     General: No focal deficit present.     Mental Status: She is alert and oriented to person, place, and time.     Cranial Nerves: No cranial nerve deficit.  Skin:    General: Skin is warm and dry.     Findings: No erythema.  Psychiatric:        Mood and Affect: Mood normal.        Behavior: Behavior normal.        Judgment: Judgment normal.      Female Chaperone present during breast and/or pelvic exam.  Assessment: 20 y.o. 26 presenting for 6 week postpartum visit  Plan: Problem List Items Addressed This Visit   None    Visit Diagnoses    Postpartum care and examination    -  Primary       1) Contraception: undecided.  2)  Pap - not eligible due to age.  3) Patient underwent screening for postpartum depression with no concerns noted.  4) Follow up 1 year for routine annual  exam  Thomasene Mohair, MD 06/29/2020 1:57 PM

## 2020-08-06 ENCOUNTER — Other Ambulatory Visit: Payer: Self-pay

## 2020-08-06 ENCOUNTER — Ambulatory Visit (INDEPENDENT_AMBULATORY_CARE_PROVIDER_SITE_OTHER): Payer: Medicaid Other

## 2020-08-06 ENCOUNTER — Other Ambulatory Visit: Payer: Self-pay | Admitting: Obstetrics & Gynecology

## 2020-08-06 DIAGNOSIS — R309 Painful micturition, unspecified: Secondary | ICD-10-CM

## 2020-08-06 LAB — POCT URINALYSIS DIPSTICK
Blood, UA: NEGATIVE
Glucose, UA: NEGATIVE
Ketones, UA: NEGATIVE
Nitrite, UA: NEGATIVE
Protein, UA: POSITIVE — AB
Spec Grav, UA: 1.01 (ref 1.010–1.025)
Urobilinogen, UA: 0.2 E.U./dL
pH, UA: >=9 — AB (ref 5.0–8.0)

## 2020-08-06 MED ORDER — SULFAMETHOXAZOLE-TRIMETHOPRIM 800-160 MG PO TABS: 1.0000 | ORAL_TABLET | Freq: Two times a day (BID) | ORAL | 0 refills | Status: AC

## 2020-08-06 NOTE — Progress Notes (Addendum)
Pt come to give CCU; sxs are is having more discomfort with urinating; feels like she doesn't completely empty her bladder; just overall uncomfortable.  Use [facility].  Pt aware we will send out cx.  We will let her know if rx sent in today; in meantime adv to drink cranberry juice - if too sweet to mix half and half with gingerale; AZO.  Pt aware per PH rx eRx'd.

## 2020-08-09 ENCOUNTER — Telehealth: Payer: Self-pay

## 2020-08-09 ENCOUNTER — Other Ambulatory Visit: Payer: Self-pay

## 2020-08-09 ENCOUNTER — Ambulatory Visit (INDEPENDENT_AMBULATORY_CARE_PROVIDER_SITE_OTHER): Payer: Medicaid Other

## 2020-08-09 ENCOUNTER — Other Ambulatory Visit: Payer: Self-pay | Admitting: Advanced Practice Midwife

## 2020-08-09 DIAGNOSIS — R6889 Other general symptoms and signs: Secondary | ICD-10-CM | POA: Diagnosis not present

## 2020-08-09 DIAGNOSIS — R300 Dysuria: Secondary | ICD-10-CM

## 2020-08-09 LAB — POCT URINALYSIS DIPSTICK
Bilirubin, UA: NEGATIVE
Blood, UA: NEGATIVE
Glucose, UA: NEGATIVE
Ketones, UA: NEGATIVE
Nitrite, UA: NEGATIVE
Protein, UA: POSITIVE — AB
Spec Grav, UA: 1.015 (ref 1.010–1.025)
Urobilinogen, UA: 0.2 E.U./dL
pH, UA: 7 (ref 5.0–8.0)

## 2020-08-09 LAB — URINE CULTURE

## 2020-08-09 NOTE — Patient Instructions (Signed)
[email or web address]

## 2020-08-09 NOTE — Progress Notes (Signed)
Please see today's note for sxs; see u/a results.  Sent for C&S.  Asked MH to sched pelvic exam for pt d/t dry skin around vagina area.

## 2020-08-09 NOTE — Telephone Encounter (Signed)
Pt calling; is positive she has a UTI; should she be seen - she has other issues too.  [phone] ok to leave vm; [facility] [phone] ok to leave vm. Issues are:  having dead skin around vag area esp the top; feels bad; had cholestasis during preg; states it feels like the cholestasis she had during preg; red and irritated; feels like she itches all over; no rashes; no fever; took benadryl 3d ago one dose; adv will send msg to on-call; will probably need to go to ED or Urgent Care - pt started crying stating it wasn't an emergency and urgent care probably wouldn't see her for it; in meantime adv to take more benadryl even a half of one; states she can't b/c it knocks her out.  Adv again will send msg to [facility].  These numbers are good all day; pharm correct in chart.

## 2020-08-12 LAB — URINE CULTURE: Organism ID, Bacteria: NO GROWTH

## 2020-08-13 ENCOUNTER — Other Ambulatory Visit: Payer: Self-pay

## 2020-08-13 ENCOUNTER — Encounter: Payer: Self-pay | Admitting: Obstetrics

## 2020-08-13 ENCOUNTER — Ambulatory Visit (INDEPENDENT_AMBULATORY_CARE_PROVIDER_SITE_OTHER): Payer: Medicaid Other | Admitting: Obstetrics

## 2020-08-13 ENCOUNTER — Other Ambulatory Visit (HOSPITAL_COMMUNITY)
Admission: RE | Admit: 2020-08-13 | Discharge: 2020-08-13 | Disposition: A | Discharge status: Home / Self Care | Payer: Medicaid Other | Source: Ambulatory Visit | Attending: Obstetrics | Admitting: Obstetrics

## 2020-08-13 VITALS — BP 112/68 | Ht 63.0 in | Wt 133.0 lb

## 2020-08-13 DIAGNOSIS — N898 Other specified noninflammatory disorders of vagina: Secondary | ICD-10-CM | POA: Insufficient documentation

## 2020-08-13 DIAGNOSIS — N812 Incomplete uterovaginal prolapse: Secondary | ICD-10-CM

## 2020-08-13 MED ORDER — FLUCONAZOLE 150 MG PO TABS: 150.0000 mg | ORAL_TABLET | Freq: Once | ORAL | 0 refills | Status: AC

## 2020-08-17 LAB — CERVICOVAGINAL ANCILLARY ONLY
Bacterial Vaginitis (gardnerella): POSITIVE — AB
Candida Glabrata: NEGATIVE
Candida Vaginitis: POSITIVE — AB
Comment: NEGATIVE
Comment: NEGATIVE
Comment: NEGATIVE

## 2020-08-18 ENCOUNTER — Encounter: Payer: Self-pay | Admitting: Obstetrics

## 2020-08-18 ENCOUNTER — Other Ambulatory Visit: Payer: Self-pay | Admitting: Obstetrics

## 2020-08-18 DIAGNOSIS — B9689 Other specified bacterial agents as the cause of diseases classified elsewhere: Secondary | ICD-10-CM

## 2020-08-18 DIAGNOSIS — B3731 Acute candidiasis of vulva and vagina: Secondary | ICD-10-CM

## 2020-08-18 MED ORDER — METRONIDAZOLE 500 MG PO TABS: 500.0000 mg | ORAL_TABLET | Freq: Two times a day (BID) | ORAL | 0 refills | Status: AC

## 2020-08-18 MED ORDER — FLUCONAZOLE 150 MG PO TABS: 150.0000 mg | ORAL_TABLET | ORAL | 5 refills | Status: AC

## 2020-08-18 NOTE — Progress Notes (Signed)
diflucan 

## 2020-08-24 NOTE — Progress Notes (Signed)
Obstetrics & Gynecology Office Visit   Chief Complaint:  Chief Complaint  Patient presents with   Pelvic Pain    X 3 months ago, constant pelvic pain since giving birth. RM 4    History of Present Illness: Adriana Kerr presents for evaluation of a vaginal discharge, and more to be checked  for "something down there" in her vagina.  She delivered vaginally on 3/28 /2022, over an intact perineum. After her 6 weeks PP visit, she still felt like her cervix was close to her vaginally opening. She requests evaluation today. She denies any urinary incontinence. She does complain of dyspareunia since her delivery, and feels discomforta with intercourse, as if "something is being hit inside with penetration. She also describes intermittant epigastric pain, that seems to occur when she has had the pelvic pressure.She has a hx of some reflux during her pregnancy.   Review of Systems:  Review of Systems  Constitutional: Negative.   HENT: Negative.    Eyes: Negative.   Respiratory: Negative.    Cardiovascular: Negative.   Gastrointestinal: Negative.   Genitourinary: Negative.   Musculoskeletal: Negative.   Skin: Negative.   Neurological: Negative.   Endo/Heme/Allergies: Negative.   Psychiatric/Behavioral: Negative.      Past Medical History:  Past Medical History:  Diagnosis Date   Anxiety    Depression    Migraines    PONV (postoperative nausea and vomiting)     Past Surgical History:  Past Surgical History:  Procedure Laterality Date   ADENOIDECTOMY     ELBOW SURGERY     KNEE SURGERY     TONSILLECTOMY      Gynecologic History: Patient's last menstrual period was 08/11/2020.  Obstetric History: [id number]  Family History:  Family History  Problem Relation Age of Onset   Diabetes type II Paternal Grandfather    Ovarian cancer Maternal Grandmother    Migraines Maternal Grandmother    Breast cancer Paternal Grandmother 66   Ulcers Mother    Fibromyalgia Mother    Migraines  Mother    Drug abuse Mother    Thyroid disease Father    Alcohol abuse Father    Migraines Sister    Turner syndrome Maternal Aunt    Crohn's disease Maternal Aunt    Stroke Maternal Aunt    Arthritis Maternal Aunt     Social History:  Social History   Socioeconomic History   Marital status: Married    Spouse name: [name]    Number of children: Not on file   Years of education: Not on file   Highest education level: Not on file  Occupational History   Not on file  Tobacco Use   Smoking status: Never    Passive exposure: Yes   Smokeless tobacco: Never  Vaping Use   Vaping Use: Never used  Substance and Sexual Activity   Alcohol use: No   Drug use: Yes    Types: Marijuana    Comment: Last use 04/29/20   Sexual activity: Yes    Birth control/protection: None  Other Topics Concern   Not on file  Social History Narrative   Not on file   Social Determinants of Health   Financial Resource Strain: Not on file  Food Insecurity: Not on file  Transportation Needs: Not on file  Physical Activity: Not on file  Stress: Not on file  Social Connections: Not on file  Intimate Partner Violence: Not on file    Allergies:  Allergies  Allergen Reactions  Amoxicillin Hives   Augmentin [Amoxicillin-Pot Clavulanate] Rash   Codeine Nausea And Vomiting   Flexeril [Cyclobenzaprine] Tinitus    Agitation and disorientation, muscle cramps   Tylenol With Codeine #3 [Acetaminophen-Codeine] Other (See Comments)    emesis    Medications: Prior to Admission medications   Medication Sig Start Date End Date Taking? Authorizing Provider  fluconazole (DIFLUCAN) 150 MG tablet Take 1 tablet (150 mg total) by mouth once a week for 4 doses. 08/18/20 09/09/20  Mirna Mires, CNM  ibuprofen (ADVIL) 600 MG tablet Take 1 tablet (600 mg total) by mouth every 6 (six) hours. Patient not taking: Reported on 06/29/2020 05/18/20   Mirna Mires, CNM  metroNIDAZOLE (FLAGYL) 500 MG tablet Take 1  tablet (500 mg total) by mouth 2 (two) times daily for 7 days. 08/18/20 08/25/20  Mirna Mires, CNM  rizatriptan (MAXALT) 5 MG tablet Take by mouth. Patient not taking: No sig reported 05/25/20   [provider]    Physical Exam Vitals:  Vitals:   08/13/20 1636  BP: 112/68   Patient's last menstrual period was 08/11/2020.  Physical Exam Constitutional:      Appearance: Normal appearance. She is normal weight.  HENT:     Head: Normocephalic and atraumatic.  Cardiovascular:     Rate and Rhythm: Normal rate and regular rhythm.  Pulmonary:     Effort: Pulmonary effort is normal.     Breath sounds: Normal breath sounds.  Abdominal:     General: Abdomen is flat. Bowel sounds are normal.     Palpations: Abdomen is soft.  Genitourinary:    Comments: Normal ext. Genitalia; no rashes or lesions. Perineum is without lacerations or irritation. POCT wet mount shows possible yeast buds, neg whiff, neg hyphae. Bimanual exam reveals a slight cystocele, and a slight uterine prolapse. The uterus is anteverted, non -enlarged and mobile. Weak kegal strength noted. Musculoskeletal:        General: Normal range of motion.     Cervical back: Normal range of motion and neck supple.  Skin:    General: Skin is warm and dry.  Neurological:     General: No focal deficit present.     Mental Status: She is alert and oriented to person, place, and time.  Psychiatric:        Mood and Affect: Mood normal.        Behavior: Behavior normal.     Assessment: 20 y.o. P2Z3007 with complaint of ?uterine prolapse and epigastric discomfort.   Plan: Problem List Items Addressed This Visit   None Visit Diagnoses     Vaginal irritation    -  Primary   Relevant Orders   Cervicovaginal ancillary only (Completed)   First degree uterine prolapse         Discussed her starting regular Kegel exercises daily for several weeks. Suggested trying different positions for IC in the meantime. She will  follow up in several weeks if she does not see any improvement. The option for pelvic floor rehab discussed, and/or referral to a [facility] Si MD. She plans to see her PCP to address her epigastric pain. Suggested she start on daily Zantac or Pepcid for a trial.  Rx for Diflucan to address likely yeast vaginitis sent . Mirna Mires, CNM  08/24/2020 5:12 PM

## 2020-09-01 ENCOUNTER — Other Ambulatory Visit (HOSPITAL_COMMUNITY): Payer: Self-pay | Admitting: Gastroenterology

## 2020-09-01 ENCOUNTER — Other Ambulatory Visit: Payer: Self-pay | Admitting: Gastroenterology

## 2020-09-01 ENCOUNTER — Telehealth: Payer: Self-pay

## 2020-09-01 ENCOUNTER — Telehealth: Payer: Self-pay | Admitting: Oncology

## 2020-09-01 DIAGNOSIS — K219 Gastro-esophageal reflux disease without esophagitis: Secondary | ICD-10-CM

## 2020-09-01 DIAGNOSIS — R110 Nausea: Secondary | ICD-10-CM

## 2020-09-01 DIAGNOSIS — R1011 Right upper quadrant pain: Secondary | ICD-10-CM

## 2020-09-01 DIAGNOSIS — R1013 Epigastric pain: Secondary | ICD-10-CM

## 2020-09-01 NOTE — Telephone Encounter (Signed)
Pt calling; dx'd c yeast inf; rx'd med; hasn't felt the greatest while taking it; stopped taking it last night d/t dizziness and having to get up with the baby; yeast inf is not better and it's not worse; it is interfering with daily activities;  How long 'til med will work or what to do?  Please send message in MyChart.  [phone]

## 2020-09-01 NOTE — Telephone Encounter (Signed)
Returned phone call to patient. I had changed her Mychart appointment with Dr. Smith Robert but she expressed that she is having a difficult time taking off time from work and would like to see any provider that day as she already has the day off. Patient agreeable to come in early for lab work and have her virtual visit with the NP that afternoon.

## 2020-09-02 ENCOUNTER — Other Ambulatory Visit: Payer: Self-pay | Admitting: Obstetrics

## 2020-09-02 ENCOUNTER — Encounter: Payer: Self-pay | Admitting: Obstetrics

## 2020-09-02 DIAGNOSIS — B373 Candidiasis of vulva and vagina: Secondary | ICD-10-CM

## 2020-09-02 DIAGNOSIS — N9089 Other specified noninflammatory disorders of vulva and perineum: Secondary | ICD-10-CM

## 2020-09-02 DIAGNOSIS — B3731 Acute candidiasis of vulva and vagina: Secondary | ICD-10-CM

## 2020-09-02 MED ORDER — NYSTATIN-TRIAMCINOLONE 100000-0.1 UNIT/GM-% EX CREA: 1.0000 "application " | TOPICAL_CREAM | Freq: Two times a day (BID) | CUTANEOUS | 1 refills | Status: DC

## 2020-09-02 MED ORDER — TERCONAZOLE 0.4 % VA CREA: 1.0000 | TOPICAL_CREAM | Freq: Every day | VAGINAL | 1 refills | Status: DC

## 2020-09-02 MED ORDER — NYSTATIN-TRIAMCINOLONE 100000-0.1 UNIT/GM-% EX CREA: 1.0000 "application " | TOPICAL_CREAM | Freq: Two times a day (BID) | CUTANEOUS | 0 refills | Status: AC

## 2020-09-02 NOTE — Telephone Encounter (Signed)
Contacted patient by phone. She took diflucan and also metronidazole for BV and Yeast. Does not feel like the external irritation that she notices around her labia is any better.  See note by [place] Sink.  I spoke with her, and she sounded irritated in tone and impatient. Discussed options for cream treatment for her, including applying Monistat externally. I also offered her Mycolog cream, and she would like to try this. Order sent to her pharmacy. I advised her to make an additiona appointment if things don't improve. Mirna Mires, CNM  09/02/2020 9:43 AM

## 2020-09-07 ENCOUNTER — Other Ambulatory Visit: Payer: Self-pay | Admitting: Gastroenterology

## 2020-09-07 DIAGNOSIS — R110 Nausea: Secondary | ICD-10-CM

## 2020-09-07 DIAGNOSIS — R1013 Epigastric pain: Secondary | ICD-10-CM

## 2020-09-08 ENCOUNTER — Other Ambulatory Visit: Payer: Self-pay

## 2020-09-08 DIAGNOSIS — D508 Other iron deficiency anemias: Secondary | ICD-10-CM

## 2020-09-09 ENCOUNTER — Inpatient Hospital Stay: Payer: Medicaid Other

## 2020-09-09 ENCOUNTER — Telehealth: Payer: Self-pay | Admitting: Hospice and Palliative Medicine

## 2020-09-09 ENCOUNTER — Telehealth: Payer: Self-pay | Admitting: Oncology

## 2020-09-09 ENCOUNTER — Inpatient Hospital Stay: Payer: Medicaid Other | Attending: Oncology | Admitting: Hospice and Palliative Medicine

## 2020-09-09 DIAGNOSIS — D508 Other iron deficiency anemias: Secondary | ICD-10-CM | POA: Insufficient documentation

## 2020-09-09 LAB — IRON AND TIBC
Iron: 118 ug/dL (ref 28–170)
Saturation Ratios: 33 % — ABNORMAL HIGH (ref 10.4–31.8)
TIBC: 353 ug/dL (ref 250–450)
UIBC: 235 ug/dL

## 2020-09-09 LAB — CBC WITH DIFFERENTIAL/PLATELET
Abs Immature Granulocytes: 0.01 10*3/uL (ref 0.00–0.07)
Basophils Absolute: 0.1 10*3/uL (ref 0.0–0.1)
Basophils Relative: 1 %
Eosinophils Absolute: 0 10*3/uL (ref 0.0–0.5)
Eosinophils Relative: 1 %
HCT: 41.2 % (ref 36.0–46.0)
Hemoglobin: 13.9 g/dL (ref 12.0–15.0)
Immature Granulocytes: 0 %
Lymphocytes Relative: 29 %
Lymphs Abs: 1.6 10*3/uL (ref 0.7–4.0)
MCH: 28.8 pg (ref 26.0–34.0)
MCHC: 33.7 g/dL (ref 30.0–36.0)
MCV: 85.3 fL (ref 80.0–100.0)
Monocytes Absolute: 0.4 10*3/uL (ref 0.1–1.0)
Monocytes Relative: 7 %
Neutro Abs: 3.4 10*3/uL (ref 1.7–7.7)
Neutrophils Relative %: 62 %
Platelets: 227 10*3/uL (ref 150–400)
RBC: 4.83 MIL/uL (ref 3.87–5.11)
RDW: 14.6 % (ref 11.5–15.5)
WBC: 5.5 10*3/uL (ref 4.0–10.5)
nRBC: 0 % (ref 0.0–0.2)

## 2020-09-09 LAB — FERRITIN: Ferritin: 96 ng/mL (ref 11–307)

## 2020-09-09 NOTE — Telephone Encounter (Signed)
Called patient to discussed appointment made after her visit with [facility] today. Patient was agreeable to all dates and times. Sending AVS via mail.

## 2020-09-09 NOTE — Telephone Encounter (Signed)
Pt had called to see if we can update the sx that patient has been having in the systems section of progress note

## 2020-09-09 NOTE — Telephone Encounter (Signed)
I received a call from patient following my virtual visit with her today. She requested that I update my ROS to better reflect the multiple symptoms that she has had over the past year. I updated my ROS to reflect that patient is having multiple symptoms (see note). However, note that these symptoms are chronic and not acute and that she is currently undergoing workup and has appropriate follow up (she sees her PCP on 09/29/2020) as outlined in my note. Additionally, these symptoms are not related to her postpartum anemia as patient now has a normal Hg and iron studies.   I did suggest that given patient's concern and symptomatic complaints that I would recommend evaluation in the emergency department. She said that she did not want to "waste time and money" by going to the ER.   Case discussed with Dr. Smith Robert.

## 2020-09-09 NOTE — Progress Notes (Addendum)
I connected with Adriana Kerr on 09/09/20 at  2:15 PM EDT by video enabled telemedicine visit and verified that I am speaking with the correct person using two identifiers.   I discussed the limitations, risks, security and privacy concerns of performing an evaluation and management service by telemedicine and the availability of in-person appointments. I also discussed with the patient that there may be a patient responsible charge related to this service. The patient expressed understanding and agreed to proceed.  Other persons participating in the visit and their role in the encounter:  none  Patient's location:  Home Provider's location:  Clinic  Chief Complaint:  Discuss results of bloodwork  History of present illness: patient is a 20 year old female currently 4 months postpartum after the delivery of her first child.  Pregnancy was complicated by cholestasis but delivery was uneventful.  Patient was referred to [place] for anemia.  Patient's hemoglobin was stable between 11-12 up until January 2022 and then it drifted down to 10 in February and March 2022.  She delivered on 05/17/2020 and hemoglobin was 7.4 on 3/29.  During her pregnancy patient had significant nausea and vomiting and was not able to eat much.  She has tried to take oral iron tablets in the past but reports having significant abdominal pain.    Patient has received Feraheme and cyanocobalamin injection on 06/16/2020 and 06/23/2020  Interval history: Patient endorses mild dizziness and upper GI pain.  She has been followed by gastroenterology and pending abdominal CT/ultrasound next week.  However, she denies any bleeding or dark tarry stools.  She reports that the dizziness predates her pregnancy and there was some discussion of evaluation for POTS but then patient became pregnant and work-up was delayed.  She has upcoming appointment to establish care with PCP.   Review of Systems  Constitutional:  Positive for chills,  malaise/fatigue and weight loss. Negative for fever.  HENT:  Negative for congestion, ear discharge and nosebleeds.   Eyes:  Positive for blurred vision, double vision and photophobia.  Respiratory:  Positive for shortness of breath. Negative for cough, hemoptysis, sputum production and wheezing.   Cardiovascular:  Positive for chest pain, palpitations and orthopnea. Negative for claudication, leg swelling and PND.  Gastrointestinal:  Positive for abdominal pain, constipation, diarrhea and nausea. Negative for blood in stool, heartburn, melena and vomiting.  Genitourinary:  Positive for urgency. Negative for dysuria, flank pain, frequency and hematuria.  Musculoskeletal:  Positive for back pain, joint pain and myalgias.  Skin:  Negative for rash.  Neurological:  Positive for dizziness, tingling, tremors, speech change, focal weakness, loss of consciousness, weakness and headaches. Negative for seizures.  Endo/Heme/Allergies:  Does not bruise/bleed easily.  Psychiatric/Behavioral:  Positive for depression and memory loss. Negative for suicidal ideas. The patient is nervous/anxious and has insomnia.    Allergies  Allergen Reactions   Amoxicillin Hives   Augmentin [Amoxicillin-Pot Clavulanate] Rash   Codeine Nausea And Vomiting   Flexeril [Cyclobenzaprine] Tinitus    Agitation and disorientation, muscle cramps   Tylenol With Codeine #3 [Acetaminophen-Codeine] Other (See Comments)    emesis    Past Medical History:  Diagnosis Date   Anxiety    Depression    Migraines    PONV (postoperative nausea and vomiting)     Past Surgical History:  Procedure Laterality Date   ADENOIDECTOMY     ELBOW SURGERY     KNEE SURGERY     TONSILLECTOMY      Social History   Socioeconomic  History   Marital status: Married    Spouse name: [name]    Number of children: Not on file   Years of education: Not on file   Highest education level: Not on file  Occupational History   Not on file  Tobacco  Use   Smoking status: Never    Passive exposure: Yes   Smokeless tobacco: Never  Vaping Use   Vaping Use: Never used  Substance and Sexual Activity   Alcohol use: No   Drug use: Yes    Types: Marijuana    Comment: Last use 04/29/20   Sexual activity: Yes    Birth control/protection: None  Other Topics Concern   Not on file  Social History Narrative   Not on file   Social Determinants of Health   Financial Resource Strain: Not on file  Food Insecurity: Not on file  Transportation Needs: Not on file  Physical Activity: Not on file  Stress: Not on file  Social Connections: Not on file  Intimate Partner Violence: Not on file    Family History  Problem Relation Age of Onset   Diabetes type II Paternal Grandfather    Ovarian cancer Maternal Grandmother    Migraines Maternal Grandmother    Breast cancer Paternal Grandmother 2   Ulcers Mother    Fibromyalgia Mother    Migraines Mother    Drug abuse Mother    Thyroid disease Father    Alcohol abuse Father    Migraines Sister    Turner syndrome Maternal Aunt    Crohn's disease Maternal Aunt    Stroke Maternal Aunt    Arthritis Maternal Aunt      Current Outpatient Medications:    rizatriptan (MAXALT) 5 MG tablet, Take by mouth., Disp: , Rfl:    fluconazole (DIFLUCAN) 150 MG tablet, Take 1 tablet (150 mg total) by mouth once a week for 4 doses. (Patient not taking: Reported on 09/09/2020), Disp: 4 tablet, Rfl: 5   ibuprofen (ADVIL) 600 MG tablet, Take 1 tablet (600 mg total) by mouth every 6 (six) hours. (Patient not taking: Reported on 09/09/2020), Disp: 30 tablet, Rfl: 0   nystatin-triamcinolone (MYCOLOG II) cream, Apply 1 application topically 2 (two) times daily for 15 days. (Patient not taking: Reported on 09/09/2020), Disp: 30 g, Rfl: 0   terconazole (TERAZOL 7) 0.4 % vaginal cream, Place 1 applicator vaginally at bedtime. (Patient not taking: Reported on 09/09/2020), Disp: 45 g, Rfl: 1  No results found.   No images  are attached to the encounter.   CMP Latest Ref Rng & Units 05/31/2020  Glucose 70 - 99 mg/dL 81  BUN 6 - 20 mg/dL 14(G)  Creatinine 8.18 - 1.00 mg/dL 5.63  Sodium 149 - 702 mmol/L 138  Potassium 3.5 - 5.1 mmol/L 3.6  Chloride 98 - 111 mmol/L 104  CO2 22 - 32 mmol/L 24  Calcium 8.9 - 10.3 mg/dL 9.3  Total Protein 6.5 - 8.1 g/dL 7.7  Total Bilirubin 0.3 - 1.2 mg/dL 0.5  Alkaline Phos 38 - 126 U/L 126  AST 15 - 41 U/L 20  ALT 0 - 44 U/L 17   CBC Latest Ref Rng & Units 09/09/2020  WBC 4.0 - 10.5 K/uL 5.5  Hemoglobin 12.0 - 15.0 g/dL 63.7  Hematocrit 85.8 - 46.0 % 41.2  Platelets 150 - 400 K/uL 227     Observation/objective: Appears in no acute distress over video visit today.  Breathing is nonlabored  Assessment and plan: Patient is a  20 year old female referred for postpartum anemia  Discussed results of blood work with the patient.  Patient is no longer anemic with hemoglobin of 13.9 today.  Her iron studies and ferritin are normal.  Discussed with Dr. Smith Robert and will plan to see patient back in 3 months for labs and 6 months for labs/MD.  Patient's abdominal pain is currently being worked up by gastroenterology.  She has an upcoming appointment in next couple of weeks to establish care with PCP and for evaluation of intermittent vertigo. These symptoms are not associated with her anemia and I encouraged patient to keep upcoming appointments with PCP/GI. She sees her PCP on 09/29/2020.  Case and plan discussed with Dr. Smith Robert  Follow-up instructions: as above  I discussed the assessment and treatment plan with the patient. The patient was provided an opportunity to ask questions and all were answered. The patient agreed with the plan and demonstrated an understanding of the instructions.   The patient was advised to call back or seek an in-person evaluation if the symptoms worsen or if the condition fails to improve as anticipated.    Visit Diagnosis: 1. Other iron deficiency  anemia     Laurette Schimke, PhD, NP-C [facility] Tel- [phone] 09/09/2020 3:00 PM;

## 2020-09-10 ENCOUNTER — Telehealth: Payer: Self-pay | Admitting: Obstetrics and Gynecology

## 2020-09-10 ENCOUNTER — Telehealth: Payer: Medicaid Other | Admitting: Oncology

## 2020-09-10 NOTE — Telephone Encounter (Signed)
Patient called in.  She is 4 months postpartum and is struggling with pp depression.  (She said that she was not going to hurt herself or anyone else.)  Wanted to see Dr. Jean Rosenthal.  Pt was advised that Dr. Jean Rosenthal was not available and will not be available next week.  Did not want to see the provider that had possible appts for today.  Tried to take the pt's phone number so I could call her back with further information.  She said she never receives our phone calls.  Only person who receives the calls is her husband.  Said her husband would not get the call until he was off of work at 5:30 today.  I did schedule her with Dr. Tiburcio Pea next Tuesday, 7/26 at 8:20.  Was not happy that she could not be seen today.  Said that we should have appts available for today.

## 2020-09-14 ENCOUNTER — Ambulatory Visit (INDEPENDENT_AMBULATORY_CARE_PROVIDER_SITE_OTHER): Payer: Medicaid Other | Admitting: Obstetrics & Gynecology

## 2020-09-14 ENCOUNTER — Other Ambulatory Visit: Payer: Self-pay

## 2020-09-14 ENCOUNTER — Encounter: Payer: Self-pay | Admitting: Obstetrics & Gynecology

## 2020-09-14 VITALS — BP 120/70 | Ht 63.0 in | Wt 123.0 lb

## 2020-09-14 DIAGNOSIS — F410 Panic disorder [episodic paroxysmal anxiety] without agoraphobia: Secondary | ICD-10-CM

## 2020-09-14 DIAGNOSIS — F419 Anxiety disorder, unspecified: Secondary | ICD-10-CM | POA: Diagnosis not present

## 2020-09-14 DIAGNOSIS — F4310 Post-traumatic stress disorder, unspecified: Secondary | ICD-10-CM

## 2020-09-14 NOTE — Progress Notes (Addendum)
Subjective:     Adriana Kerr is a 20 y.o. female [phone] who presents for concerns related to anxiety, mild depression, since delivery but mostly she has had these sx's lifelong and perhaps heightened since delivery and w newborn in house. Current symptoms include weight loss and a lot of nausea and vomiting (seeing GI for a work up) especially when she gets panic feelings and worry type anxiety about any little thing.  She has been told in therapy in past that she may have some PTSD realted to her pregnancy being so difficult at times  . Symptoms have been gradually worsening since that time. Patient denies hopelessness, impaired memory, and suicidal thoughts with specific plan. Previous treatment includes:  some psychotherapy, she is wary of meds and has not been on any.  SHe has family history of substance and domestic abuse . She complains of the following side effects from the treatment: none.  [place] 18  The following portions of the patient's history were reviewed and updated as appropriate: allergies, current medications, past family history, past medical history, past social history, past surgical history, and problem list.  Review of Systems Pertinent items are noted in HPI.    Objective:    BP 120/70   Ht 5\' 3"  (1.6 m)   Wt 123 lb (55.8 kg)   BMI 21.79 kg/m   General:  alert, cooperative, and no distress  Affect & Behavior:  full facial expressions, good grooming, good insight, normal perception, normal reasoning, and normal thought patterns  She does have some rapid speech.  No flight of ideas.  She is self aware      Assessment:  1. Anxiety 2. Panic attack possibly worsened by post traumatic stress disorder (PTSD) from pregnancy- Ambulatory referral to Psychiatry - No meds desired at this time - Pt is safe and functional 3. Birth control Pt declines hormones, does not want any insertable (IUD Nexplanon) and would rather defer this planning to another time 4. N/V/GI  concerns, cont to see GI for investigation for long standing n/v issues    A total of 25 minutes were spent face-to-face with the patient as well as preparation, review, communication, and documentation during this encounter.   , MD, Annamarie Major Ob/Gyn, [facility] 09/14/2020  9:13 AM

## 2020-09-15 ENCOUNTER — Ambulatory Visit
Admission: RE | Admit: 2020-09-15 | Discharge: 2020-09-15 | Disposition: A | Discharge status: Home / Self Care | Payer: Medicaid Other | Source: Ambulatory Visit | Attending: Gastroenterology | Admitting: Gastroenterology

## 2020-09-15 ENCOUNTER — Ambulatory Visit: Payer: Medicaid Other

## 2020-09-15 DIAGNOSIS — R1013 Epigastric pain: Secondary | ICD-10-CM

## 2020-09-15 DIAGNOSIS — R110 Nausea: Secondary | ICD-10-CM

## 2020-09-15 DIAGNOSIS — R1011 Right upper quadrant pain: Secondary | ICD-10-CM | POA: Insufficient documentation

## 2020-09-15 DIAGNOSIS — K219 Gastro-esophageal reflux disease without esophagitis: Secondary | ICD-10-CM | POA: Diagnosis present

## 2020-09-27 ENCOUNTER — Telehealth: Payer: Medicaid Other | Admitting: Oncology

## 2020-10-02 ENCOUNTER — Other Ambulatory Visit: Payer: Self-pay | Admitting: Obstetrics & Gynecology

## 2020-10-02 NOTE — Progress Notes (Signed)
I contacted several places to find care for patient. The last place that I found, that takes her insurance, contacted pt but she didn't want to schedule at this time.

## 2020-12-08 ENCOUNTER — Inpatient Hospital Stay: Payer: Medicaid Other | Attending: Oncology

## 2020-12-10 ENCOUNTER — Other Ambulatory Visit: Payer: Medicaid Other

## 2020-12-13 ENCOUNTER — Other Ambulatory Visit: Payer: Self-pay

## 2020-12-13 ENCOUNTER — Inpatient Hospital Stay: Payer: Medicaid Other | Attending: Internal Medicine

## 2020-12-13 DIAGNOSIS — E538 Deficiency of other specified B group vitamins: Secondary | ICD-10-CM | POA: Diagnosis present

## 2020-12-13 DIAGNOSIS — D508 Other iron deficiency anemias: Secondary | ICD-10-CM | POA: Insufficient documentation

## 2020-12-13 LAB — CBC WITH DIFFERENTIAL/PLATELET
Abs Immature Granulocytes: 0.01 10*3/uL (ref 0.00–0.07)
Basophils Absolute: 0 10*3/uL (ref 0.0–0.1)
Basophils Relative: 1 %
Eosinophils Absolute: 0 10*3/uL (ref 0.0–0.5)
Eosinophils Relative: 1 %
HCT: 36.1 % (ref 36.0–46.0)
Hemoglobin: 12.4 g/dL (ref 12.0–15.0)
Immature Granulocytes: 0 %
Lymphocytes Relative: 49 %
Lymphs Abs: 1.7 10*3/uL (ref 0.7–4.0)
MCH: 31.2 pg (ref 26.0–34.0)
MCHC: 34.3 g/dL (ref 30.0–36.0)
MCV: 90.9 fL (ref 80.0–100.0)
Monocytes Absolute: 0.2 10*3/uL (ref 0.1–1.0)
Monocytes Relative: 7 %
Neutro Abs: 1.5 10*3/uL — ABNORMAL LOW (ref 1.7–7.7)
Neutrophils Relative %: 42 %
Platelets: 197 10*3/uL (ref 150–400)
RBC: 3.97 MIL/uL (ref 3.87–5.11)
RDW: 12.1 % (ref 11.5–15.5)
WBC: 3.5 10*3/uL — ABNORMAL LOW (ref 4.0–10.5)
nRBC: 0 % (ref 0.0–0.2)

## 2020-12-13 LAB — VITAMIN B12: Vitamin B-12: 450 pg/mL (ref 180–914)

## 2020-12-14 ENCOUNTER — Other Ambulatory Visit: Payer: Self-pay | Admitting: *Deleted

## 2020-12-14 ENCOUNTER — Telehealth: Payer: Self-pay | Admitting: *Deleted

## 2020-12-14 DIAGNOSIS — D508 Other iron deficiency anemias: Secondary | ICD-10-CM

## 2020-12-14 NOTE — Telephone Encounter (Signed)
Pt was called about her results and the extra labs in 6 weeks and then jennifer called her to get appt 11/16

## 2020-12-14 NOTE — Telephone Encounter (Signed)
-----   Message from Creig Hines, MD sent at 12/14/2020 11:14 AM EDT ----- She is not anemic. B12 is normal. Wbc is lower today than before but has never been low. Nothing to do at this time. She will keep her appt with me in jan 2023 but she should come for cbc with diff ferritin and iron studies in 6 weeks

## 2021-01-05 ENCOUNTER — Inpatient Hospital Stay: Payer: Medicaid Other | Attending: Oncology

## 2021-01-05 ENCOUNTER — Other Ambulatory Visit: Payer: Self-pay

## 2021-01-05 DIAGNOSIS — D508 Other iron deficiency anemias: Secondary | ICD-10-CM

## 2021-01-05 DIAGNOSIS — D509 Iron deficiency anemia, unspecified: Secondary | ICD-10-CM | POA: Insufficient documentation

## 2021-01-05 LAB — CBC WITH DIFFERENTIAL/PLATELET
Abs Immature Granulocytes: 0.02 10*3/uL (ref 0.00–0.07)
Basophils Absolute: 0.1 10*3/uL (ref 0.0–0.1)
Basophils Relative: 1 %
Eosinophils Absolute: 0 10*3/uL (ref 0.0–0.5)
Eosinophils Relative: 0 %
HCT: 37.8 % (ref 36.0–46.0)
Hemoglobin: 12.5 g/dL (ref 12.0–15.0)
Immature Granulocytes: 0 %
Lymphocytes Relative: 31 %
Lymphs Abs: 1.8 10*3/uL (ref 0.7–4.0)
MCH: 30.2 pg (ref 26.0–34.0)
MCHC: 33.1 g/dL (ref 30.0–36.0)
MCV: 91.3 fL (ref 80.0–100.0)
Monocytes Absolute: 0.3 10*3/uL (ref 0.1–1.0)
Monocytes Relative: 4 %
Neutro Abs: 3.7 10*3/uL (ref 1.7–7.7)
Neutrophils Relative %: 64 %
Platelets: 268 10*3/uL (ref 150–400)
RBC: 4.14 MIL/uL (ref 3.87–5.11)
RDW: 11.8 % (ref 11.5–15.5)
WBC: 5.9 10*3/uL (ref 4.0–10.5)
nRBC: 0 % (ref 0.0–0.2)

## 2021-01-05 LAB — FERRITIN: Ferritin: 51 ng/mL (ref 11–307)

## 2021-01-05 LAB — IRON AND TIBC
Iron: 89 ug/dL (ref 28–170)
Saturation Ratios: 27 % (ref 10.4–31.8)
TIBC: 326 ug/dL (ref 250–450)
UIBC: 237 ug/dL

## 2021-01-19 DIAGNOSIS — R202 Paresthesia of skin: Secondary | ICD-10-CM | POA: Insufficient documentation

## 2021-02-02 ENCOUNTER — Other Ambulatory Visit: Payer: Self-pay | Admitting: Gastroenterology

## 2021-02-02 DIAGNOSIS — R110 Nausea: Secondary | ICD-10-CM

## 2021-02-02 DIAGNOSIS — R1013 Epigastric pain: Secondary | ICD-10-CM

## 2021-02-09 ENCOUNTER — Inpatient Hospital Stay: Admission: RE | Admit: 2021-02-09 | Payer: Medicaid Other | Source: Ambulatory Visit

## 2021-03-08 ENCOUNTER — Other Ambulatory Visit: Payer: Self-pay | Admitting: *Deleted

## 2021-03-08 DIAGNOSIS — D508 Other iron deficiency anemias: Secondary | ICD-10-CM

## 2021-03-14 ENCOUNTER — Inpatient Hospital Stay: Payer: Medicaid Other | Attending: Internal Medicine

## 2021-03-14 ENCOUNTER — Encounter: Payer: Self-pay | Admitting: Oncology

## 2021-03-14 ENCOUNTER — Other Ambulatory Visit: Payer: Self-pay

## 2021-03-14 ENCOUNTER — Inpatient Hospital Stay (HOSPITAL_BASED_OUTPATIENT_CLINIC_OR_DEPARTMENT_OTHER): Payer: Medicaid Other | Admitting: Oncology

## 2021-03-14 VITALS — BP 106/70 | HR 71 | Temp 98.4°F | Resp 16 | Ht 63.0 in | Wt 123.0 lb

## 2021-03-14 DIAGNOSIS — Z803 Family history of malignant neoplasm of breast: Secondary | ICD-10-CM

## 2021-03-14 DIAGNOSIS — D508 Other iron deficiency anemias: Secondary | ICD-10-CM

## 2021-03-14 DIAGNOSIS — D509 Iron deficiency anemia, unspecified: Secondary | ICD-10-CM | POA: Insufficient documentation

## 2021-03-14 DIAGNOSIS — D708 Other neutropenia: Secondary | ICD-10-CM | POA: Diagnosis not present

## 2021-03-14 DIAGNOSIS — Z808 Family history of malignant neoplasm of other organs or systems: Secondary | ICD-10-CM | POA: Diagnosis not present

## 2021-03-14 LAB — FERRITIN: Ferritin: 41 ng/mL (ref 11–307)

## 2021-03-14 LAB — CBC WITH DIFFERENTIAL/PLATELET
Abs Immature Granulocytes: 0 10*3/uL (ref 0.00–0.07)
Basophils Absolute: 0 10*3/uL (ref 0.0–0.1)
Basophils Relative: 1 %
Eosinophils Absolute: 0 10*3/uL (ref 0.0–0.5)
Eosinophils Relative: 1 %
HCT: 38.2 % (ref 36.0–46.0)
Hemoglobin: 12.7 g/dL (ref 12.0–15.0)
Immature Granulocytes: 0 %
Lymphocytes Relative: 57 %
Lymphs Abs: 2 10*3/uL (ref 0.7–4.0)
MCH: 30.5 pg (ref 26.0–34.0)
MCHC: 33.2 g/dL (ref 30.0–36.0)
MCV: 91.8 fL (ref 80.0–100.0)
Monocytes Absolute: 0.2 10*3/uL (ref 0.1–1.0)
Monocytes Relative: 5 %
Neutro Abs: 1.3 10*3/uL — ABNORMAL LOW (ref 1.7–7.7)
Neutrophils Relative %: 36 %
Platelets: 202 10*3/uL (ref 150–400)
RBC: 4.16 MIL/uL (ref 3.87–5.11)
RDW: 12.1 % (ref 11.5–15.5)
WBC: 3.5 10*3/uL — ABNORMAL LOW (ref 4.0–10.5)
nRBC: 0 % (ref 0.0–0.2)

## 2021-03-14 LAB — IRON AND TIBC
Iron: 174 ug/dL — ABNORMAL HIGH (ref 28–170)
Saturation Ratios: 49 % — ABNORMAL HIGH (ref 10.4–31.8)
TIBC: 353 ug/dL (ref 250–450)
UIBC: 179 ug/dL

## 2021-03-15 ENCOUNTER — Other Ambulatory Visit: Payer: Self-pay | Admitting: *Deleted

## 2021-03-15 DIAGNOSIS — D508 Other iron deficiency anemias: Secondary | ICD-10-CM

## 2021-03-15 DIAGNOSIS — D649 Anemia, unspecified: Secondary | ICD-10-CM

## 2021-03-15 DIAGNOSIS — E538 Deficiency of other specified B group vitamins: Secondary | ICD-10-CM

## 2021-03-17 ENCOUNTER — Encounter: Payer: Self-pay | Admitting: Oncology

## 2021-03-17 NOTE — Progress Notes (Signed)
I connected with Adriana Kerr on 03/17/21 at 11:30 AM EST by telephone visit and verified that I am speaking with the correct person using two identifiers.   I discussed the limitations, risks, security and privacy concerns of performing an evaluation and management service by telemedicine and the availability of in-person appointments. I also discussed with the patient that there may be a patient responsible charge related to this service. The patient expressed understanding and agreed to proceed.  Other persons participating in the visit and their role in the encounter:  none  Patient's location:  home Provider's location:  work  Risk analyst Complaint:  routine f/u of iron deficiency anemia  History of present illness: Patient is a 21 year old female with history of iron deficiency anemia during pregnancy.  She received IV iron at that time.  Interval history presently patient reports improvement in her energy levels.  Denies any recurrent infections   Review of Systems  Constitutional:  Positive for malaise/fatigue. Negative for chills, fever and weight loss.  HENT:  Negative for congestion, ear discharge and nosebleeds.   Eyes:  Negative for blurred vision.  Respiratory:  Negative for cough, hemoptysis, sputum production, shortness of breath and wheezing.   Cardiovascular:  Negative for chest pain, palpitations, orthopnea and claudication.  Gastrointestinal:  Negative for abdominal pain, blood in stool, constipation, diarrhea, heartburn, melena, nausea and vomiting.  Genitourinary:  Negative for dysuria, flank pain, frequency, hematuria and urgency.  Musculoskeletal:  Negative for back pain, joint pain and myalgias.  Skin:  Negative for rash.  Neurological:  Negative for dizziness, tingling, focal weakness, seizures, weakness and headaches.  Endo/Heme/Allergies:  Does not bruise/bleed easily.  Psychiatric/Behavioral:  Negative for depression and suicidal ideas. The patient does not have  insomnia.    Allergies  Allergen Reactions   Amoxicillin Hives   Augmentin [Amoxicillin-Pot Clavulanate] Rash   Codeine Nausea And Vomiting   Flexeril [Cyclobenzaprine] Tinitus    Agitation and disorientation, muscle cramps   Tylenol With Codeine #3 [Acetaminophen-Codeine] Other (See Comments)    emesis   Penicillins Hives    Happened while on amoxicillin, ER MD said she should avoid all penicillin     Past Medical History:  Diagnosis Date   Anxiety    Depression    Migraines    PONV (postoperative nausea and vomiting)     Past Surgical History:  Procedure Laterality Date   ADENOIDECTOMY     ELBOW SURGERY     KNEE SURGERY     TONSILLECTOMY      Social History   Socioeconomic History   Marital status: Married    Spouse name: Thurmond Butts    Number of children: Not on file   Years of education: Not on file   Highest education level: Not on file  Occupational History   Not on file  Tobacco Use   Smoking status: Never    Passive exposure: Yes   Smokeless tobacco: Never  Vaping Use   Vaping Use: Never used  Substance and Sexual Activity   Alcohol use: No   Drug use: Yes    Types: Marijuana    Comment: Last use 04/29/20   Sexual activity: Yes    Birth control/protection: None  Other Topics Concern   Not on file  Social History Narrative   Not on file   Social Determinants of Health   Financial Resource Strain: Not on file  Food Insecurity: Not on file  Transportation Needs: Not on file  Physical Activity: Not on file  Stress: Not on file  Social Connections: Not on file  Intimate Partner Violence: Not on file    Family History  Problem Relation Age of Onset   Diabetes type II Paternal Grandfather    Ovarian cancer Maternal Grandmother    Migraines Maternal Grandmother    Breast cancer Paternal Grandmother 42   Ulcers Mother    Fibromyalgia Mother    Migraines Mother    Drug abuse Mother    Thyroid disease Father    Alcohol abuse Father    Migraines  Sister    Turner syndrome Maternal Aunt    Crohn's disease Maternal Aunt    Stroke Maternal Aunt    Arthritis Maternal Aunt      Current Outpatient Medications:    rizatriptan (MAXALT) 5 MG tablet, Take by mouth., Disp: , Rfl:    ibuprofen (ADVIL) 600 MG tablet, Take 1 tablet (600 mg total) by mouth every 6 (six) hours. (Patient not taking: Reported on 09/09/2020), Disp: 30 tablet, Rfl: 0   ondansetron (ZOFRAN-ODT) 4 MG disintegrating tablet, Take 4 mg by mouth every 8 (eight) hours as needed., Disp: , Rfl:    terconazole (TERAZOL 7) 0.4 % vaginal cream, Place 1 applicator vaginally at bedtime. (Patient not taking: Reported on 09/09/2020), Disp: 45 g, Rfl: 1  No results found.  No images are attached to the encounter.   CMP Latest Ref Rng & Units 05/31/2020  Glucose 70 - 99 mg/dL 81  BUN 6 - 20 mg/dL 23(H)  Creatinine 0.44 - 1.00 mg/dL 0.76  Sodium 135 - 145 mmol/L 138  Potassium 3.5 - 5.1 mmol/L 3.6  Chloride 98 - 111 mmol/L 104  CO2 22 - 32 mmol/L 24  Calcium 8.9 - 10.3 mg/dL 9.3  Total Protein 6.5 - 8.1 g/dL 7.7  Total Bilirubin 0.3 - 1.2 mg/dL 0.5  Alkaline Phos 38 - 126 U/L 126  AST 15 - 41 U/L 20  ALT 0 - 44 U/L 17   CBC Latest Ref Rng & Units 03/14/2021  WBC 4.0 - 10.5 K/uL 3.5(L)  Hemoglobin 12.0 - 15.0 g/dL 12.7  Hematocrit 36.0 - 46.0 % 38.2  Platelets 150 - 400 K/uL 202     Assessment and plan: Patient is a 21 year old female and this is a follow-up visit for iron deficiency anemia and neutropenia    Iron deficiency anemia: Patient is not presently anemic with an H&H of 12.7/38.2.  Ferritin levels are normal at 41 iron saturation 49% and TIBC normal at 353.  She therefore does not require any IV iron at this time.  Patient noted to have intermittent leukopenia/neutropenia which has been ongoing for the last 4 months when her white count dropped to 3.5 with an [place] between 1.3-1.5.  We will check CBC with differential again in 3 in 6 months and I will see her back  in 6 months.  We will also check B12 and HIV testing in 6 months time  Follow-up instructions: As above  I discussed the assessment and treatment plan with the patient. The patient was provided an opportunity to ask questions and all were answered. The patient agreed with the plan and demonstrated an understanding of the instructions.   The patient was advised to call back or seek an in-person evaluation if the symptoms worsen or if the condition fails to improve as anticipated.   I provided 11 minutes of non face-to-face telephone visit time during this encounter. Time spent in reviewing present and comparing prior labs during this  encounter and coordinating care.   Visit Diagnosis: 1. Iron deficiency anemia, unspecified iron deficiency anemia type   2. Other neutropenia ([place])     Dr. Randa Evens, MD, MPH [facility] Tel- [phone] 03/17/2021 12:09 PM

## 2021-03-28 ENCOUNTER — Other Ambulatory Visit: Payer: Self-pay

## 2021-03-28 ENCOUNTER — Encounter: Payer: Self-pay | Admitting: Gastroenterology

## 2021-03-28 ENCOUNTER — Ambulatory Visit (INDEPENDENT_AMBULATORY_CARE_PROVIDER_SITE_OTHER): Payer: Medicaid Other | Admitting: Gastroenterology

## 2021-03-28 VITALS — BP 116/75 | HR 69 | Temp 97.3°F | Ht 64.0 in | Wt 126.6 lb

## 2021-03-28 DIAGNOSIS — R1013 Epigastric pain: Secondary | ICD-10-CM | POA: Diagnosis not present

## 2021-03-28 DIAGNOSIS — Z8719 Personal history of other diseases of the digestive system: Secondary | ICD-10-CM | POA: Diagnosis not present

## 2021-03-28 MED ORDER — OMEPRAZOLE 40 MG PO CPDR: 40.0000 mg | DELAYED_RELEASE_CAPSULE | Freq: Two times a day (BID) | ORAL | 3 refills | Status: DC

## 2021-03-28 NOTE — Progress Notes (Signed)
Wyline Mood MD, MRCP(U.K) [address]  [address]  [place], [place] [address]  Main: [phone]  Fax: [phone]   Gastroenterology Consultation  Referring Provider:     Wynona Canes Primary Care Physician:  [facility] Primary Gastroenterologist:  Dr. Wyline Mood  Reason for Consultation:     Transfer of care and second opinion        HPI:   Adriana Kerr is a 21 y.o. y/o female who was previously under the care of of [facility] clinic gastroenterology last visit has been at their office in July 2022 when she was seen and evaluated for epigastric pain which has been ongoing since March 2022 after her delivery.  History of hyperemesis gravidarum.  She was recommended to have testing for celiac disease, H. pylori ultrasound of the gallbladder and commenced on PPI.  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  09/15/2020 ultrasound right upper quadrant shows no abnormality 09/03/2020 H. pylori stool antigen negative TTG IgA was negative but she had a low serum IgA level. 03/14/2021 hemoglobin 12.7 g  There was a plan for an endoscopy and HIDA scan but the patient requested transfer of care and a second opinion.  She states that she has been having issues with heartburn throughout her pregnancy and since the pregnancy it is better but still persists.  Complains of burning pain in the epigastric area radiating to the lower abdomen a few times a week.  Worse when she has food intake.  Denies any regurgitation.  Has not tried a PPI.  She was told she needed an EGD but she suffers from anxiety and PTSD and like to avoid 1.  Denies any NSAID use.  Normal bowel movements.  Has a bowel movement every day.  Had lost weight during her pregnancy but has been stable since.  Stress makes her symptoms worse.  She is not breast-feeding at this point of time.  Past Medical History:  Diagnosis Date   Anxiety    Depression    Migraines     PONV (postoperative nausea and vomiting)     Past Surgical History:  Procedure Laterality Date   ADENOIDECTOMY     ELBOW SURGERY     KNEE SURGERY     TONSILLECTOMY      Prior to Admission medications   Medication Sig Start Date End Date Taking? Authorizing Provider  ACCU-CHEK GUIDE test strip  10/19/20   [provider]  Accu-Chek Softclix Lancets lancets SMARTSIG:2 Topical Twice Daily 10/19/20   [provider]  ibuprofen (ADVIL) 600 MG tablet Take 1 tablet (600 mg total) by mouth every 6 (six) hours. Patient not taking: Reported on 09/09/2020 05/18/20   Mirna Mires, CNM  lidocaine (LIDODERM) 5 % Place onto the skin. 01/27/21 01/22/22  [provider]  ondansetron (ZOFRAN-ODT) 4 MG disintegrating tablet Take 4 mg by mouth every 8 (eight) hours as needed. 01/20/21   [provider]  rizatriptan (MAXALT) 5 MG tablet Take by mouth. 05/25/20   [provider]  terconazole (TERAZOL 7) 0.4 % vaginal cream Place 1 applicator vaginally at bedtime. Patient not taking: Reported on 09/09/2020 09/02/20   Mirna Mires, CNM    Family History  Problem Relation Age of Onset   Diabetes type II Paternal Grandfather    Ovarian cancer Maternal Grandmother    Migraines Maternal Grandmother    Breast cancer Paternal Grandmother 5   Ulcers  Mother    Fibromyalgia Mother    Migraines Mother    Drug abuse Mother    Thyroid disease Father    Alcohol abuse Father    Migraines Sister    Turner syndrome Maternal Aunt    Crohn's disease Maternal Aunt    Stroke Maternal Aunt    Arthritis Maternal Aunt      Social History   Tobacco Use   Smoking status: Never    Passive exposure: Yes   Smokeless tobacco: Never  Vaping Use   Vaping Use: Never used  Substance Use Topics   Alcohol use: No   Drug use: Yes    Types: Marijuana    Comment: Last use 04/29/20    Allergies as of 03/28/2021 - Review Complete 03/14/2021  Allergen Reaction Noted    Amoxicillin Hives 08/31/2017   Augmentin [amoxicillin-pot clavulanate] Rash 04/20/2016   Codeine Nausea And Vomiting 07/18/2014   Flexeril [cyclobenzaprine] Tinitus 08/13/2014   Tylenol with codeine #3 [acetaminophen-codeine] Other (See Comments) 05/13/2013   Penicillins Hives 09/29/2020    Review of Systems:    All systems reviewed and negative except where noted in HPI.   Physical Exam:  There were no vitals taken for this visit. No LMP recorded. Psych:  Alert and cooperative. Normal mood and affect. General:   Alert,  Well-developed, well-nourished, pleasant and cooperative in NAD Head:  Normocephalic and atraumatic. Eyes:  Sclera clear, no icterus.   Conjunctiva pink. Ears:  Normal auditory acuity. Nose:  No deformity, discharge, or lesions. Lungs:  Respirations even and unlabored.  Clear throughout to auscultation.   No wheezes, crackles, or rhonchi. No acute distress. Heart:  Regular rate and rhythm; no murmurs, clicks, rubs, or gallops. Abdomen:  Normal bowel sounds.  No bruits.  Soft, non-tender and non-distended without masses, hepatosplenomegaly or hernias noted.  No guarding or rebound tenderness.    Neurologic:  Alert and oriented x3;  grossly normal neurologically. Psych:  Alert and cooperative. Normal mood and affect.  Imaging Studies: No results found.  Assessment and Plan:   Adriana Kerr is a 21 y.o. y/o female Patient here today for second opinion and transfer of care for her GI issues.  Previously seen and evaluated at [facility] clinic gastroenterology.  Last office visit in July 2022.   Plan 1.  TTG IgG to be checked due to low IgA levels 2.  Recheck H. pylori breath test she is not on a PPI presently 3.  Prilosec 40 mg twice daily.  She is not breast-feeding confirmed with her. 4.  If trial of PPI does not work may require endoscopy I did discuss with her an endoscopy without sedation as she is very anxious about being put to sleep   Follow up in 6  weeks time video visit  Dr Jonathon Bellows MD,MRCP(U.K)

## 2021-03-29 LAB — CELIAC AB TTG DGP TIGA
Antigliadin Abs, IgA: 3 units (ref 0–19)
Gliadin IgG: 3 units (ref 0–19)
IgA/Immunoglobulin A, Serum: 76 mg/dL — ABNORMAL LOW (ref 87–352)
Tissue Transglut Ab: <2 U/mL (ref 0–5)
Transglutaminase IgA: <2 U/mL (ref 0–3)

## 2021-03-30 LAB — H. PYLORI BREATH TEST: H pylori Breath Test: NEGATIVE

## 2021-03-31 ENCOUNTER — Encounter: Payer: Self-pay | Admitting: Gastroenterology

## 2021-04-18 ENCOUNTER — Telehealth: Payer: Self-pay | Admitting: *Deleted

## 2021-04-18 ENCOUNTER — Inpatient Hospital Stay: Payer: Medicaid Other | Attending: Oncology

## 2021-04-18 ENCOUNTER — Other Ambulatory Visit: Payer: Self-pay

## 2021-04-18 ENCOUNTER — Encounter: Payer: Self-pay | Admitting: Oncology

## 2021-04-18 ENCOUNTER — Other Ambulatory Visit: Payer: Self-pay | Admitting: *Deleted

## 2021-04-18 DIAGNOSIS — E538 Deficiency of other specified B group vitamins: Secondary | ICD-10-CM

## 2021-04-18 DIAGNOSIS — D509 Iron deficiency anemia, unspecified: Secondary | ICD-10-CM | POA: Insufficient documentation

## 2021-04-18 DIAGNOSIS — D649 Anemia, unspecified: Secondary | ICD-10-CM

## 2021-04-18 DIAGNOSIS — D508 Other iron deficiency anemias: Secondary | ICD-10-CM

## 2021-04-18 DIAGNOSIS — D708 Other neutropenia: Secondary | ICD-10-CM | POA: Diagnosis not present

## 2021-04-18 LAB — CBC
HCT: 37.1 % (ref 36.0–46.0)
Hemoglobin: 12.7 g/dL (ref 12.0–15.0)
MCH: 30.7 pg (ref 26.0–34.0)
MCHC: 34.2 g/dL (ref 30.0–36.0)
MCV: 89.6 fL (ref 80.0–100.0)
Platelets: 169 10*3/uL (ref 150–400)
RBC: 4.14 MIL/uL (ref 3.87–5.11)
RDW: 12.1 % (ref 11.5–15.5)
WBC: 3.3 10*3/uL — ABNORMAL LOW (ref 4.0–10.5)
nRBC: 0 % (ref 0.0–0.2)

## 2021-04-18 LAB — IRON AND TIBC
Iron: 61 ug/dL (ref 28–170)
Saturation Ratios: 19 % (ref 10.4–31.8)
TIBC: 328 ug/dL (ref 250–450)
UIBC: 267 ug/dL

## 2021-04-18 LAB — VITAMIN B12: Vitamin B-12: 447 pg/mL (ref 180–914)

## 2021-04-18 LAB — FERRITIN: Ferritin: 34 ng/mL (ref 11–307)

## 2021-04-18 NOTE — Telephone Encounter (Signed)
She can come for labs today or tomorrow

## 2021-04-18 NOTE — Telephone Encounter (Signed)
Patient called stating that she wants to have a set of labs drawn today or tomorrow in addition to the lab apptst she has scheduled. Please advise

## 2021-04-21 ENCOUNTER — Encounter: Payer: Self-pay | Admitting: *Deleted

## 2021-04-21 ENCOUNTER — Telehealth: Payer: Self-pay | Admitting: *Deleted

## 2021-04-21 NOTE — Telephone Encounter (Signed)
Patient called requesting a return call to discuss her lab results drawn the other day ? ?CBC ?Order: [id number] ?Status: Final result    ?Visible to patient: Yes (seen)    ?Next appt: 05/09/2021 at 01:00 PM in Gastroenterology Jonathon Bellows, MD)    ?Dx: Normocytic anemia; B12 deficiency    ?0 Result Notes ?          ?Component Ref Range & Units 3 d ago 1 mo ago 3 mo ago 4 mo ago 7 mo ago 10 mo ago 11 mo ago  ?WBC 4.0 - 10.5 K/uL 3.3 Low   3.5 Low   5.9  3.5 Low   5.5  4.4  7.2 R   ?RBC 3.87 - 5.11 MIL/uL 4.14  4.16  4.14  3.97  4.83  3.77 Low   3.11 Low  R   ?Hemoglobin 12.0 - 15.0 g/dL 12.7  12.7  12.5  12.4  13.9  9.8 Low   8.2 Low  R   ?HCT 36.0 - 46.0 % 37.1  38.2  37.8  36.1  41.2  31.6 Low   25.6 Low  R   ?MCV 80.0 - 100.0 fL 89.6  91.8  91.3  90.9  85.3  83.8  82 R   ?MCH 26.0 - 34.0 pg 30.7  30.5  30.2  31.2  28.8  26.0  26.4 Low  R   ?MCHC 30.0 - 36.0 g/dL 34.2  33.2  33.1  34.3  33.7  31.0  32.0 R   ?RDW 11.5 - 15.5 % 12.1  12.1  11.8  12.1  14.6  13.9  13.3 R   ?Platelets 150 - 400 K/uL 169  202  268  197  227  413 High   306 R   ?nRBC 0.0 - 0.2 % 0.0  0.0  0.0  0.0  0.0  0.0    ?Comment: Performed at [facility], [address] [place], [place] [address]  ?Neutrophils Relative %   36 R  64 R  42 R  62 R  42 R    ?Basophils Absolute   0.0 R  0.1 R  0.0 R  0.1 R  0.1 R    ?Immature Granulocytes   0 R  0 R  0 R  0 R  0 R    ?Abs Immature Granulocytes   0.00 R, CM  0.02 R, CM  0.01 R, CM  0.01 R, CM  0.01 R, CM    ?Neutro Abs   1.3 Low  R  3.7 R  1.5 Low  R  3.4 R  1.9 R    ?Lymphocytes Relative   57 R  31 R  49 R  29 R  49 R    ?Lymphs Abs   2.0 R  1.8 R  1.7 R  1.6 R  2.2 R    ?Monocytes Relative   5 R  4 R  7 R  7 R  6 R    ?Monocytes Absolute   0.2 R  0.3 R  0.2 R  0.4 R  0.3 R    ?Eosinophils Relative   1 R  0 R  1 R  1 R  1 R    ?Eosinophils Absolute   0.0 R  0.0 R  0.0 R  0.0 R  0.0 R    ?Basophils Relative   1 R  1 R  1 R  1 R  2 R    ?  Resulting Agency  [facility] [place] [facility] [place] [facility] [place]  [facility] [place] [facility] [place] [facility]  ?  ? ?  ?  ?Specimen Collected: 04/18/21 14:00 Last Resulted: 04/18/21 14:15  ?  ?  Lab Flowsheet   ? Order Details   ? View Encounter   ? Lab and Collection Details   ? Routing   ? Result History    ?View Encounter Conversation    ?  ?CM=Additional comments  R=Reference range differs from displayed range    ?  ?Result Care Coordination ? ? ?Patient Communication ? ? Add Comments   Seen Back to Top  ?  ?  ? ?Other Results from 04/18/2021 ? ?Vitamin B12 ?Order: [id number] ?Status: Final result    ?Visible to patient: Yes (seen)    ?Next appt: 05/09/2021 at 01:00 PM in Gastroenterology Jonathon Bellows, MD)    ?Dx: Normocytic anemia; B12 deficiency    ?0 Result Notes ?      ?Component Ref Range & Units 3 d ago 4 mo ago 10 mo ago  ?Vitamin B-12 180 - 914 pg/mL 447  450 CM  278 CM   ?Comment: (NOTE)  ?This assay is not validated for testing neonatal or  ?myeloproliferative syndrome specimens for Vitamin B12 levels.  ?Performed at [facility], [place] [address], [place], [place]  ?[id number]   ?Resulting Agency  [place] [facility] [place] [facility] [place] [facility]  ?  ? ?  ?  ?Specimen Collected: 04/18/21 14:00 Last Resulted: 04/18/21 20:05  ?  ?  Lab Flowsheet   ? Order Details   ? View Encounter   ? Lab and Collection Details   ? Routing   ? Result History    ?View Encounter Conversation    ?  ?CM=Additional comments    ?  ?Result Care Coordination ? ? ?Patient Communication ? ? Add Comments   Seen Back to Top  ?  ?  ? ?  ?Ferritin ?Order: [id number] ?Status: Final result    ?Visible to patient: Yes (seen)    ?Next appt: 05/09/2021 at 01:00 PM in Gastroenterology Jonathon Bellows, MD)    ?Dx: Normocytic anemia; B12 deficiency    ?0 Result Notes ?        ?Component Ref Range & Units 3 d ago 1 mo ago 3 mo ago 7 mo ago 10 mo ago  ?Ferritin 11 - 307 ng/mL 34  41 CM  51 CM  96 CM  5 Low  CM   ?Comment: Performed at [facility], [address] [place], [place] [address]  ?Resulting Agency  [place] [facility] [place] [facility] [place] [facility] [place] [facility] [place] [facility]  ?  ? ?  ?  ?Specimen Collected: 04/18/21 14:00 Last Resulted: 04/18/21 15:34  ?  ?  ? ?

## 2021-04-22 ENCOUNTER — Encounter: Payer: Self-pay | Admitting: Oncology

## 2021-04-22 NOTE — Telephone Encounter (Signed)
Dr Smith Robert states she sees it and she will talk to her on monday ?

## 2021-04-28 ENCOUNTER — Other Ambulatory Visit: Payer: Self-pay | Admitting: *Deleted

## 2021-04-28 ENCOUNTER — Other Ambulatory Visit: Payer: Self-pay

## 2021-04-28 DIAGNOSIS — D708 Other neutropenia: Secondary | ICD-10-CM

## 2021-04-28 DIAGNOSIS — D649 Anemia, unspecified: Secondary | ICD-10-CM

## 2021-05-09 ENCOUNTER — Telehealth (INDEPENDENT_AMBULATORY_CARE_PROVIDER_SITE_OTHER): Payer: Medicaid Other | Admitting: Gastroenterology

## 2021-05-09 DIAGNOSIS — R1013 Epigastric pain: Secondary | ICD-10-CM | POA: Diagnosis not present

## 2021-05-09 NOTE — Progress Notes (Signed)
?  ?Adriana Kerr , MD ?[address]  ?[address]  ?[place], [place] [address]  ?Main: [phone]  ?Fax: [phone] ? ? ?Primary Care Physician: [facility] ? ?Virtual Visit via Video Note ? ?I connected with patient on 05/09/21 at  1:00 PM EDT by video and verified that I am speaking with the correct person using two identifiers. ?  ?I discussed the limitations, risks, security and privacy concerns of performing an evaluation and management service by video  and the availability of in person appointments. I also discussed with the patient that there may be a patient responsible charge related to this service. The patient expressed understanding and agreed to proceed. ? ?Location of Patient: Home ?Location of Provider: Home ?Persons involved: Patient and provider only ? ? ?History of Present Illness: ?Chief Complaint  ?Patient presents with  ? dyspepsia  ? ? ?HPI: Adriana Kerr is a 21 y.o. female ? ?Summary of history : ? ?Initially referred and seen on 03/28/2021 for a transfer of care and second opinion.  Previously under the care of [facility] clinic gastroenterology till July 2022.  At that point of time she had been evaluated for epigastric pain.  When she saw me the main issues with dyspepsia.  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  She had been having issues with heartburn throughout her pregnancy and since the pregnancy it had been better but still persisted.  The discomfort was in her epigastric area radiating to the lower abdomen few times a week.  Worse with food intake.  History of PTSD and had previously tried to avoid an endoscopy.  No NSAID use.  Normal bowel movements.  Weight has been stable.  Stress seems to make the symptoms worse. ? ?09/15/2020 ultrasound right upper quadrant shows no abnormality ?09/03/2020 H. pylori stool antigen negative TTG IgA was negative but she had a low serum IgA level. ?03/14/2021 hemoglobin 12.7  g ? ?Interval history   03/28/2021-05/09/2021 ? ? ?03/28/2021: H. pylori breath test negative.  Celiac serology negative. ?04/18/2021 hemoglobin 12.7 g, B12 normal, iron studies normal. ? ?Since her last visit she states she is neither better nor worse with her symptoms.  Her stress levels have not changed significantly.  However she does say that she has noticed that her abdominal pain is worse than during her periods.  ? ? ?She has been taking her PPI. ? ? ? ? ?Current Outpatient Medications  ?Medication Sig Dispense Refill  ? omeprazole (PRILOSEC) 40 MG capsule Take 1 capsule (40 mg total) by mouth in the morning and at bedtime. 90 capsule 3  ? ondansetron (ZOFRAN-ODT) 4 MG disintegrating tablet Take 4 mg by mouth every 8 (eight) hours as needed.    ? rizatriptan (MAXALT) 5 MG tablet Take by mouth.    ? ?No current facility-administered medications for this visit.  ? ? ?Allergies as of 05/09/2021 - Review Complete 03/28/2021  ?Allergen Reaction Noted  ? Amoxicillin Hives 08/31/2017  ? Augmentin [amoxicillin-pot clavulanate] Rash 04/20/2016  ? Codeine Nausea And Vomiting 07/18/2014  ? Flexeril [cyclobenzaprine] Tinitus 08/13/2014  ? Tylenol with codeine #3 [acetaminophen-codeine] Other (See Comments) 05/13/2013  ? Penicillins Hives 09/29/2020  ? ? ?Review of Systems:    ?All systems reviewed and negative except where noted in HPI.  ?General Appearance:    Alert, cooperative, no distress, appears stated age  ?Head:    Normocephalic, without obvious abnormality, atraumatic  ?Eyes:    PERRL,  conjunctiva/corneas clear,  ?Ears:    Grossly normal hearing   ? ?Neurologic:  Grossly normal   ? ?Observations/Objective: ? ?Labs: ?CMP  ?   ?Component Value Date/Time  ? NA 138 05/31/2020 1414  ? NA 139 02/05/2013 0948  ? K 3.6 05/31/2020 1414  ? K 3.7 02/05/2013 0948  ? CL 104 05/31/2020 1414  ? CL 105 02/05/2013 0948  ? CO2 24 05/31/2020 1414  ? CO2 30 (H) 02/05/2013 0948  ? GLUCOSE 81 05/31/2020 1414  ? GLUCOSE 72 02/05/2013 0948   ? BUN 23 (H) 05/31/2020 1414  ? BUN 11 02/05/2013 0948  ? CREATININE 0.76 05/31/2020 1414  ? CREATININE 0.55 (L) 02/05/2013 0948  ? CALCIUM 9.3 05/31/2020 1414  ? CALCIUM 9.4 02/05/2013 0948  ? PROT 7.7 05/31/2020 1414  ? ALBUMIN 4.4 05/31/2020 1414  ? AST 20 05/31/2020 1414  ? ALT 17 05/31/2020 1414  ? ALKPHOS 126 05/31/2020 1414  ? BILITOT 0.5 05/31/2020 1414  ? GFRNONAA >60 05/31/2020 1414  ? GFRAA >60 10/14/2019 1220  ? ?Lab Results  ?Component Value Date  ? WBC 3.3 (L) 04/18/2021  ? HGB 12.7 04/18/2021  ? HCT 37.1 04/18/2021  ? MCV 89.6 04/18/2021  ? PLT 169 04/18/2021  ? ? ?Imaging Studies: ?No results found. ? ?Assessment and Plan:  ? ?Adriana Kerr is a 21 y.o. y/o female here to follow-up for dyspepsia.  Negative celiac serology and H. pylori breath test.  Completed a trial of Prilosec 40 mg twice a day.  No significant improvement with Prilosec.  Discussed that the neck step from the GI point of view would need to perform an EGD which she is not keen on at this point of time.  The fact that her abdominal pain gets worse during her.  It could indicate that she may have endometriosis.  Advised her to see a GYN doctor.  Offered to send her referral she said she would contact her own GYN. ? ?Plan ?1.  Follow-up with GYN for possible endometriosis evaluation ?2.  Trial of IBgard samples will be provided at the office. ?3.  We will follow-up in 4 months if no better we will discuss options including endoscopy which at this point of time she is not keen on.  Other options would be to consider a pain modulator such as amitriptyline. ? ? ? ? ?  ?I discussed the assessment and treatment plan with the patient. The patient was provided an opportunity to ask questions and all were answered. The patient agreed with the plan and demonstrated an understanding of the instructions. ?  ?The patient was advised to call back or seek an in-person evaluation if the symptoms worsen or if the condition fails to improve as  anticipated. ? ?I provided 12 minutes of face-to-face time during this encounter. ? ?Dr Adriana Mood MD,MRCP [facility]) ?Gastroenterology/Hepatology ?Pager: [phone] ? ? ?Speech recognition software was used to dictate this note.   ?

## 2021-05-12 DIAGNOSIS — G90A Postural orthostatic tachycardia syndrome (POTS): Secondary | ICD-10-CM | POA: Insufficient documentation

## 2021-05-12 DIAGNOSIS — Q796 Ehlers-Danlos syndrome, unspecified: Secondary | ICD-10-CM | POA: Insufficient documentation

## 2021-06-01 ENCOUNTER — Ambulatory Visit: Payer: Medicaid Other | Admitting: Obstetrics and Gynecology

## 2021-06-13 ENCOUNTER — Inpatient Hospital Stay: Payer: Medicaid Other | Attending: Oncology

## 2021-06-13 ENCOUNTER — Encounter: Payer: Self-pay | Admitting: Oncology

## 2021-06-13 DIAGNOSIS — D709 Neutropenia, unspecified: Secondary | ICD-10-CM | POA: Insufficient documentation

## 2021-06-13 DIAGNOSIS — D649 Anemia, unspecified: Secondary | ICD-10-CM

## 2021-06-13 DIAGNOSIS — D708 Other neutropenia: Secondary | ICD-10-CM

## 2021-06-13 DIAGNOSIS — D509 Iron deficiency anemia, unspecified: Secondary | ICD-10-CM | POA: Diagnosis present

## 2021-06-13 DIAGNOSIS — E538 Deficiency of other specified B group vitamins: Secondary | ICD-10-CM

## 2021-06-13 LAB — CBC WITH DIFFERENTIAL/PLATELET
Abs Immature Granulocytes: 0.01 10*3/uL (ref 0.00–0.07)
Basophils Absolute: 0.1 10*3/uL (ref 0.0–0.1)
Basophils Relative: 1 %
Eosinophils Absolute: 0 10*3/uL (ref 0.0–0.5)
Eosinophils Relative: 1 %
HCT: 40.7 % (ref 36.0–46.0)
Hemoglobin: 13.5 g/dL (ref 12.0–15.0)
Immature Granulocytes: 0 %
Lymphocytes Relative: 38 %
Lymphs Abs: 2.2 10*3/uL (ref 0.7–4.0)
MCH: 30.3 pg (ref 26.0–34.0)
MCHC: 33.2 g/dL (ref 30.0–36.0)
MCV: 91.5 fL (ref 80.0–100.0)
Monocytes Absolute: 0.4 10*3/uL (ref 0.1–1.0)
Monocytes Relative: 7 %
Neutro Abs: 3.1 10*3/uL (ref 1.7–7.7)
Neutrophils Relative %: 53 %
Platelets: 240 10*3/uL (ref 150–400)
RBC: 4.45 MIL/uL (ref 3.87–5.11)
RDW: 11.9 % (ref 11.5–15.5)
WBC: 5.8 10*3/uL (ref 4.0–10.5)
nRBC: 0 % (ref 0.0–0.2)

## 2021-06-13 LAB — IRON AND TIBC
Iron: 96 ug/dL (ref 28–170)
Saturation Ratios: 26 % (ref 10.4–31.8)
TIBC: 374 ug/dL (ref 250–450)
UIBC: 278 ug/dL

## 2021-06-13 LAB — PROTIME-INR
INR: 1 (ref 0.8–1.2)
Prothrombin Time: 13.5 seconds (ref 11.4–15.2)

## 2021-06-13 LAB — APTT: aPTT: 29 seconds (ref 24–36)

## 2021-06-13 LAB — VITAMIN B12: Vitamin B-12: 325 pg/mL (ref 180–914)

## 2021-06-13 LAB — FERRITIN: Ferritin: 30 ng/mL (ref 11–307)

## 2021-06-13 LAB — PLATELET FUNCTION ASSAY: Collagen / Epinephrine: 114 seconds (ref 0–193)

## 2021-06-13 LAB — HIV ANTIBODY (ROUTINE TESTING W REFLEX): HIV Screen 4th Generation wRfx: NONREACTIVE

## 2021-06-16 LAB — VON WILLEBRAND PANEL
Coagulation Factor VIII: 131 % (ref 56–140)
Ristocetin Co-factor, Plasma: 79 % (ref 50–200)
Von Willebrand Antigen, Plasma: 105 % (ref 50–200)

## 2021-06-16 LAB — COAG STUDIES INTERP REPORT

## 2021-06-29 ENCOUNTER — Ambulatory Visit: Payer: Medicaid Other | Admitting: Obstetrics and Gynecology

## 2021-07-22 ENCOUNTER — Ambulatory Visit
Admission: EM | Admit: 2021-07-22 | Discharge: 2021-07-22 | Disposition: A | Discharge status: Home / Self Care | Payer: Medicaid Other | Attending: Emergency Medicine | Admitting: Emergency Medicine

## 2021-07-22 DIAGNOSIS — M5442 Lumbago with sciatica, left side: Secondary | ICD-10-CM | POA: Diagnosis not present

## 2021-07-22 MED ORDER — IBUPROFEN 600 MG PO TABS: 600.0000 mg | ORAL_TABLET | Freq: Four times a day (QID) | ORAL | 0 refills | Status: AC | PRN

## 2021-07-22 MED ORDER — METHOCARBAMOL 500 MG PO TABS: 500.0000 mg | ORAL_TABLET | Freq: Two times a day (BID) | ORAL | 0 refills | Status: DC | PRN

## 2021-07-22 NOTE — Discharge Instructions (Addendum)
Take ibuprofen as needed for discomfort.  Take the muscle relaxer as needed for muscle spasm; Do not drive, operate machinery, or drink alcohol with this medication as it can cause drowsiness.   Follow up with your primary care provider or an orthopedist if your symptoms are not improving.     

## 2021-07-22 NOTE — ED Provider Notes (Signed)
[facility]    CSN: [id number] Arrival date & time: 07/22/21  1141      History   Chief Complaint Chief Complaint  Patient presents with   Back Pain    My daughter hit my back today and i keep getting shooting pains and tingling now - Entered by patient   Tailbone Pain    HPI Adriana Kerr is a 21 y.o. female.  Patient presents with pain in her left lower back after being hit by her young daughter with her sippy cup this morning.  The pain intermittently radiates down her left leg.  No open wounds, redness, bruising, numbness, weakness, or other symptoms.  Treatment at home with ibuprofen and Tylenol.  Her medical history includes chronic thoracic spine pain, chronic pain of scapula, postural orthostatic tachycardia syndrome, migraine headaches, neutropenia, iron deficiency anemia, B12 deficiency.   The history is provided by the patient and the spouse.   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 Authorizing Provider  ibuprofen (ADVIL) 600 MG tablet Take 1 tablet (600 mg total) by  mouth every 6 (six) hours as needed. 07/22/21  Yes Mickie Bail, NP  methocarbamol (ROBAXIN) 500 MG tablet Take 1 tablet (500 mg total) by mouth 2 (two) times daily as needed for muscle spasms. 07/22/21  Yes Mickie Bail, NP  omeprazole (PRILOSEC) 40 MG capsule Take 1 capsule (40 mg total) by mouth in the morning and at bedtime. 03/28/21 05/31/21  Wyline Mood, MD  ondansetron (ZOFRAN-ODT) 4 MG disintegrating tablet Take 4 mg by mouth every 8 (eight) hours as needed. 01/20/21   [provider]  rizatriptan (MAXALT) 5 MG tablet Take by mouth. 05/25/20   [provider]    Family History Family History  Problem Relation Age of Onset   Diabetes type II Paternal Grandfather    Ovarian cancer Maternal Grandmother    Migraines Maternal Grandmother    Breast cancer Paternal Grandmother 6   Ulcers Mother    Fibromyalgia Mother    Migraines Mother    Drug abuse Mother    Thyroid disease Father    Alcohol abuse Father    Migraines Sister    Turner syndrome Maternal Aunt    Crohn's disease  Maternal Aunt    Stroke Maternal Aunt    Arthritis Maternal Aunt     Social History Social History   Tobacco Use   Smoking status: Never    Passive exposure: Yes   Smokeless tobacco: Never  Vaping Use   Vaping Use: Never used  Substance Use Topics   Alcohol use: No   Drug use: Yes    Types: Marijuana    Comment: Last use 04/29/20     Allergies   Amoxicillin, Augmentin [amoxicillin-pot clavulanate], Codeine, Flexeril [cyclobenzaprine], Tylenol with codeine #3 [acetaminophen-codeine], and Penicillins   Review of Systems Review of Systems  Constitutional:  Negative for chills and fever.  Gastrointestinal:  Negative for abdominal pain, diarrhea and vomiting.  Genitourinary:  Negative for dysuria and hematuria.  Musculoskeletal:  Positive for back pain. Negative for gait problem.  Skin:  Negative for color change and rash.  Neurological:  Negative for weakness and numbness.  All  other systems reviewed and are negative.   Physical Exam Triage Vital Signs ED Triage Vitals [07/22/21 1156]  Enc Vitals Group     BP      Pulse      Resp      Temp      Temp src      SpO2      Weight      Height      Head Circumference      Peak Flow      Pain Score 10     Pain Loc      Pain Edu?      Excl. in GC?    No data found.  Updated Vital Signs BP 109/78 (BP Location: Left Arm)   Pulse 84   Temp 97.9 F (36.6 C) (Oral)   Resp 16   LMP 07/03/2021   SpO2 100%   Visual Acuity Right Eye Distance:   Left Eye Distance:   Bilateral Distance:    Right Eye Near:   Left Eye Near:    Bilateral Near:     Physical Exam Vitals and nursing note reviewed.  Constitutional:      General: She is not in acute distress.    Appearance: Normal appearance. She is well-developed. She is not ill-appearing.  HENT:     Mouth/Throat:     Mouth: Mucous membranes are moist.  Cardiovascular:     Rate and Rhythm: Normal rate and regular rhythm.     Heart sounds: Normal heart sounds.  Pulmonary:     Effort: Pulmonary effort is normal. No respiratory distress.     Breath sounds: Normal breath sounds.  Abdominal:     General: Bowel sounds are normal.     Palpations: Abdomen is soft.     Tenderness: There is no abdominal tenderness. There is no guarding or rebound.  Musculoskeletal:        General: Tenderness present. No swelling or deformity. Normal range of motion.     Cervical back: Neck supple.       Back:  Skin:    General: Skin is warm and dry.     Capillary Refill: Capillary refill takes less than 2 seconds.     Findings: No bruising, erythema, lesion or rash.  Neurological:     General: No focal deficit present.     Mental Status: She is alert and oriented to person, place, and time.     Sensory: No sensory deficit.     Motor: No weakness.     Gait:  Gait normal.  Psychiatric:        Mood and Affect: Mood normal.        Behavior: Behavior normal.     UC  Treatments / Results  Labs (all labs ordered are listed, but only abnormal results are displayed) Labs Reviewed - No data to display  EKG   Radiology No results found.  Procedures Procedures (including critical care time)  Medications Ordered in UC Medications - No data to display  Initial Impression / Assessment and Plan / UC Course  I have reviewed the triage vital signs and the nursing notes.  Pertinent labs & imaging results that were available during my care of the patient were reviewed by me and considered in my medical decision making (see chart for details).   Left low back pain with sciatica.  Treating with ibuprofen and methocarbamol.  Precautions for drowsiness with methocarbamol discussed.  Education provided on back pain.  Instructed patient to follow up with orthopedics if symptoms are not improving.  She agrees to plan of care.    Final Clinical Impressions(s) / UC Diagnoses   Final diagnoses:  Acute left-sided low back pain with left-sided sciatica     Discharge Instructions      Take ibuprofen as needed for discomfort.  Take the muscle relaxer as needed for muscle spasm; Do not drive, operate machinery, or drink alcohol with this medication as it can cause drowsiness.   Follow up with your primary care provider or an orthopedist if your symptoms are not improving.         ED Prescriptions     Medication Sig Dispense Auth. Provider   ibuprofen (ADVIL) 600 MG tablet Take 1 tablet (600 mg total) by mouth every 6 (six) hours as needed. 30 tablet Mickie Bailate, Semiah Konczal H, NP   methocarbamol (ROBAXIN) 500 MG tablet Take 1 tablet (500 mg total) by mouth 2 (two) times daily as needed for muscle spasms. 10 tablet Mickie Bailate, Trigger Frasier H, NP      I have reviewed the PDMP during this encounter.   Mickie Bailate, Teniya Filter H, NP 07/22/21 1243

## 2021-07-22 NOTE — ED Triage Notes (Signed)
Patient presents to Urgent Care with complaints of back pain from her toddler hitting her in the back with a sippy cup. She states she also sat on her cup. She states she is now experiencing shooting pain and tingling sensation to her coccyx. Treating pain with tylenol/advil combo. She states she has a hx of chronic nerve pain unsure if this incident made it worse.

## 2021-08-11 IMAGING — US US MFM OB LIMITED
1 series · 12 of 12 positions shown · non-contrast
Comparison: none

[Series 1: us mfm ob limited · 12 of 12 slices shown]
[im 1/12]
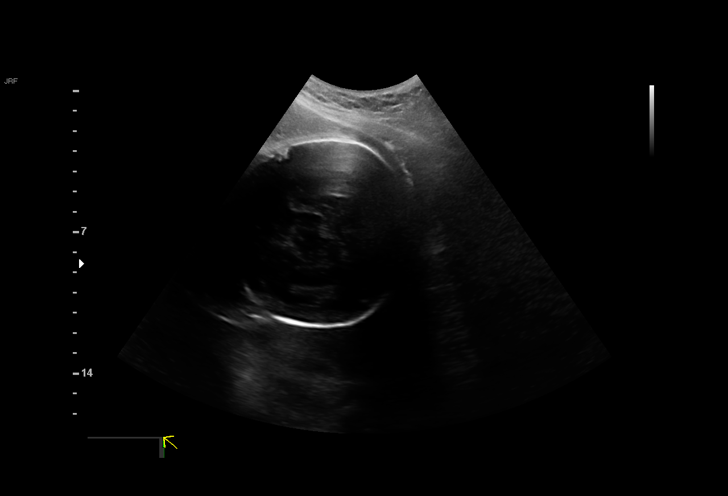
[im 2/12]
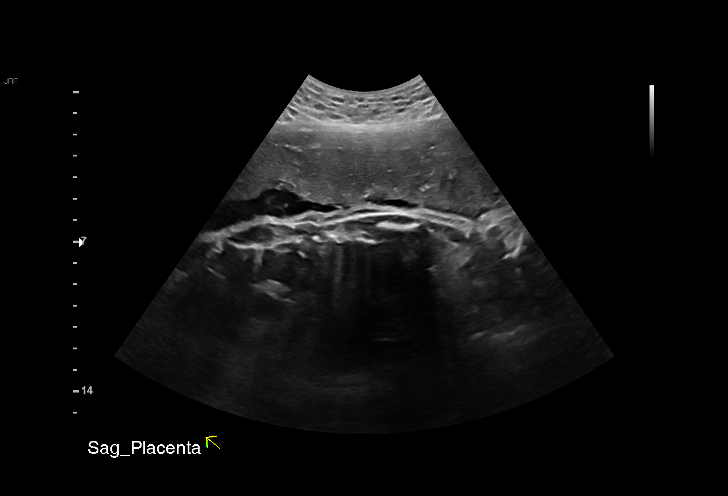
[im 3/12]
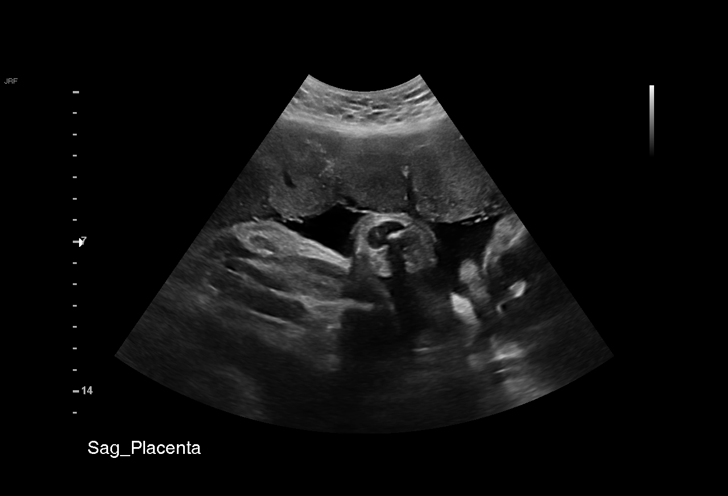
[im 4/12]
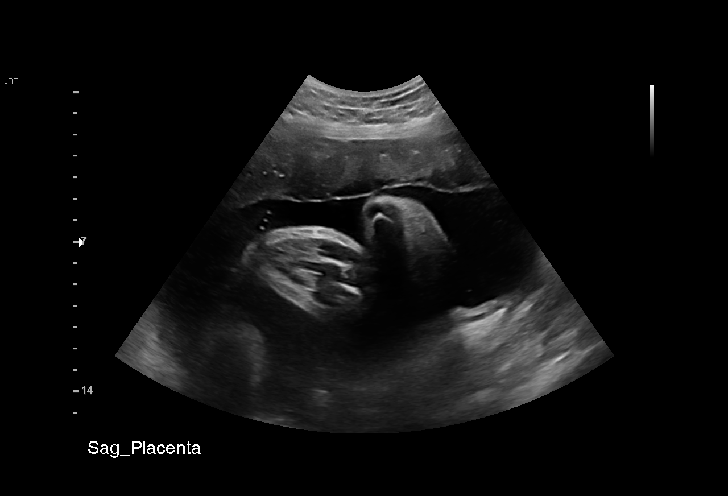
[im 5/12]
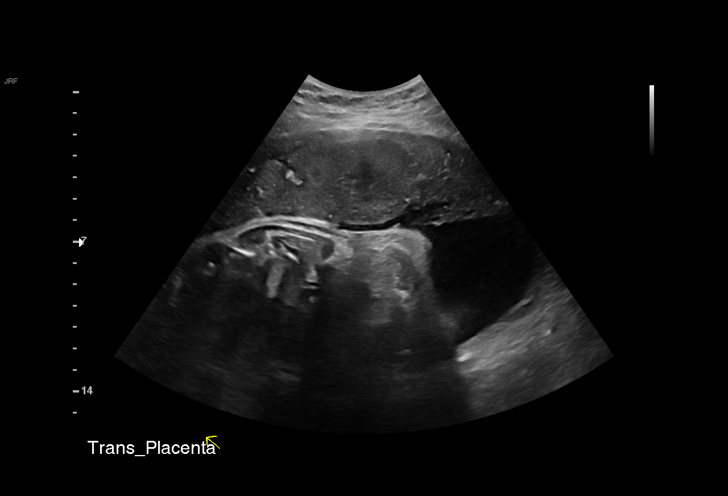
[im 6/12]
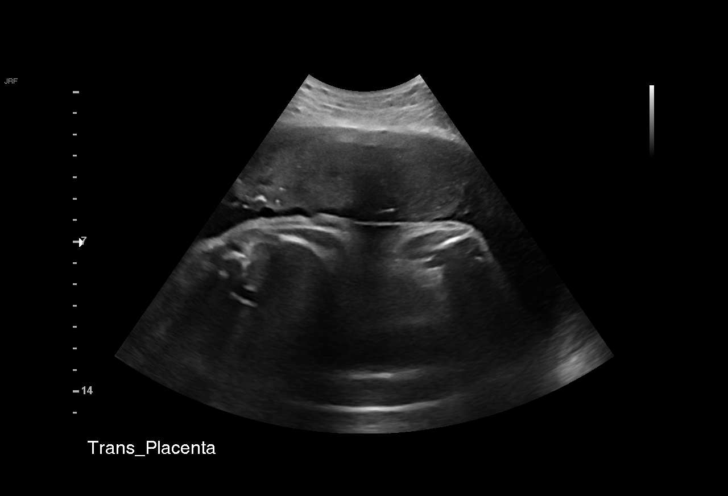
[im 7/12]
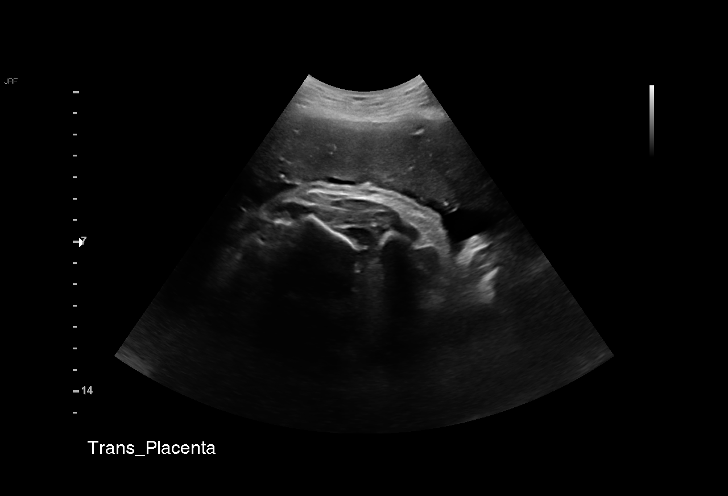
[im 8/12]
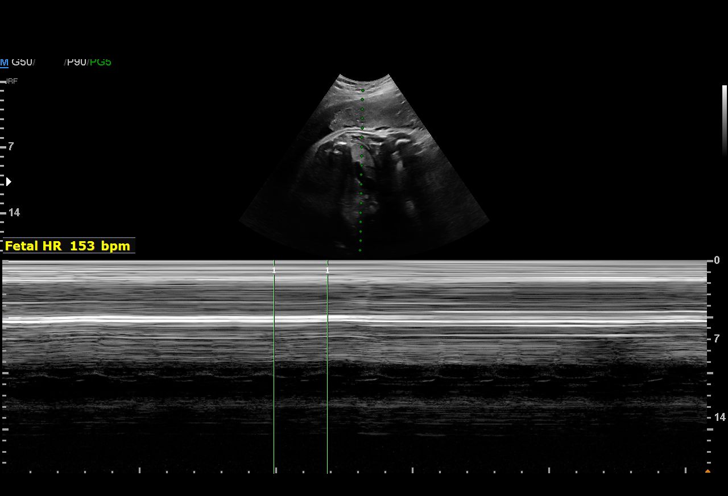
[im 9/12]
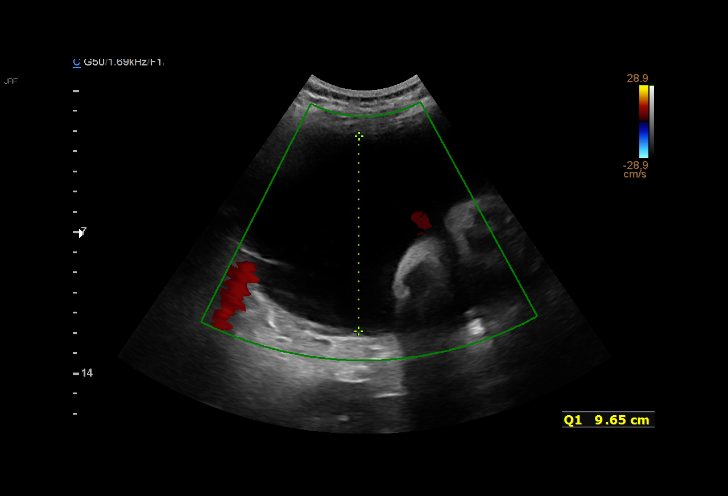
[im 10/12]
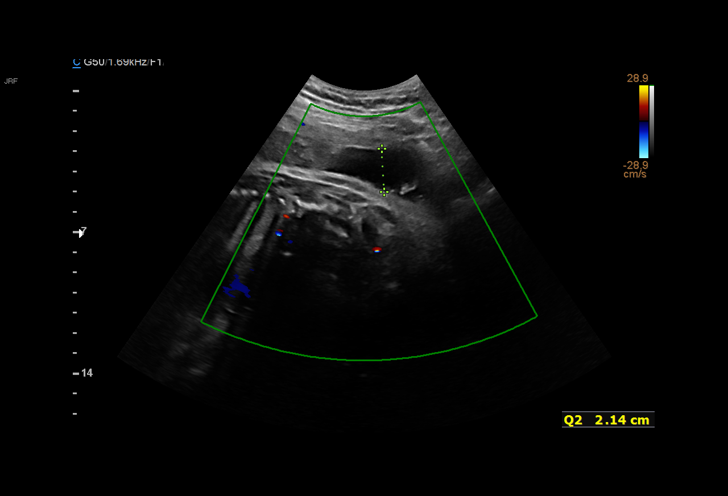
[im 11/12]
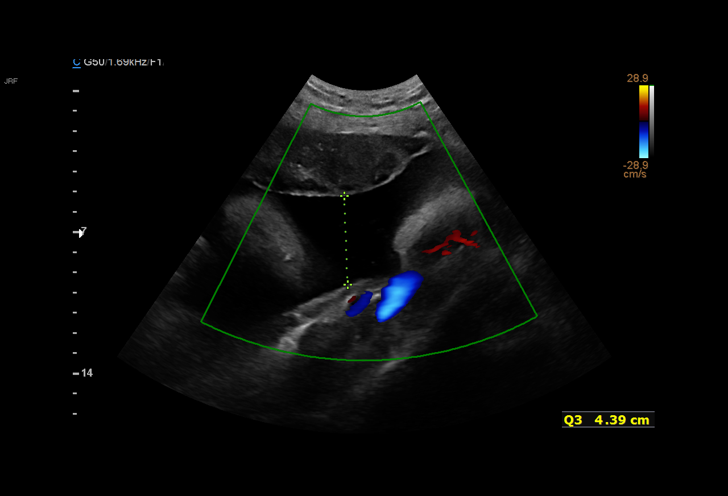
[im 12/12]
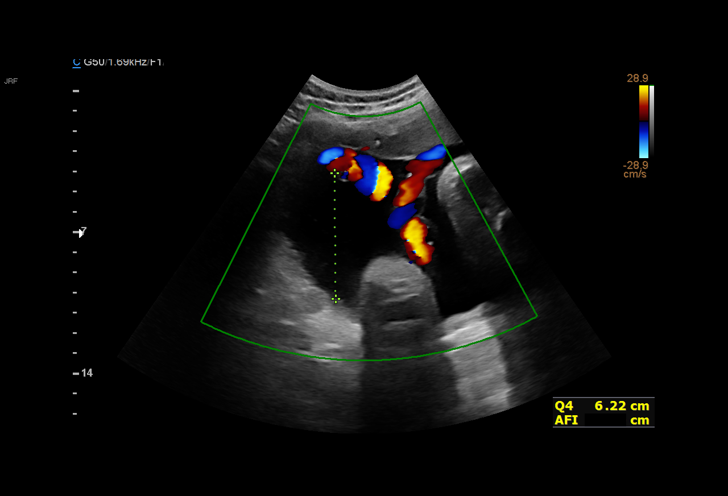

[12 of 12 positions shown; findings below may reference images not displayed]

YUNIEL                                       MALICK

 1  US MFM OB LIMITED                     76815.01    NIRAH TE

Indications

 Cholestasis of pregnancy, third trimester      [id number].[id number].G
 36 weeks gestation of pregnancy
Fetal Evaluation

 Num Of Fetuses:         1
 Fetal Heart Rate(bpm):  153
 Cardiac Activity:       Observed
 Presentation:           Cephalic
 Placenta:               Anterior

 AFI Sum(cm)     %Tile       Largest Pocket(cm)
 22.4            86

 RUQ(cm)       RLQ(cm)       LUQ(cm)        LLQ(cm)

OB History

 Gravidity:    3         Term:   0        Prem:   0        SAB:   2
 TOP:          0       Ectopic:  0        Living: 0
Gestational Age

 Best:          36w 4d     Det. By:  Early Ultrasound         EDD:   06/06/20
                                     (10/14/19)
Impression

 Limited exam to assess amniotic fluid for cholestasis in
 pregnancy
 Good fetal movement and amniotic fluid volume.
Recommendations

 Planned delivery on [REDACTED] 05/16/20

## 2021-08-17 DIAGNOSIS — F419 Anxiety disorder, unspecified: Secondary | ICD-10-CM | POA: Insufficient documentation

## 2021-09-08 ENCOUNTER — Telehealth (INDEPENDENT_AMBULATORY_CARE_PROVIDER_SITE_OTHER): Payer: Medicaid Other | Admitting: Gastroenterology

## 2021-09-08 ENCOUNTER — Other Ambulatory Visit: Payer: Self-pay

## 2021-09-08 DIAGNOSIS — R1013 Epigastric pain: Secondary | ICD-10-CM | POA: Diagnosis not present

## 2021-09-08 NOTE — Progress Notes (Signed)
Wyline Mood , MD [address]  [address]  [place], [place] [address]  Main: [phone]  Fax: [phone]   Primary Care Physician: [facility]  Virtual Visit via Video Note  I connected with patient on 09/08/21 at  1:30 PM EDT by video and verified that I am speaking with the correct person using two identifiers.   I discussed the limitations, risks, security and privacy concerns of performing an evaluation and management service by video  and the availability of in person appointments. I also discussed with the patient that there may be a patient responsible charge related to this service. The patient expressed understanding and agreed to proceed.  Location of Patient: Home Location of Provider: Home Persons involved: Patient and provider only   History of Present Illness: Chief Complaint  Patient presents with   Follow-up    HPI: Adriana Kerr is a 21 y.o. female      Summary of history :   Initially referred and seen on 03/28/2021 for a transfer of care and second opinion.  Previously under the care of [facility] clinic gastroenterology till July 2022.  At that point of time she had been evaluated for epigastric pain.  When she saw me the main issues with dyspepsia.  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  She had been having issues with heartburn throughout her pregnancy and since the pregnancy it had been better but still persisted.  The discomfort was in her epigastric area radiating to the lower abdomen few times a week.  Worse with food intake.  History of PTSD and had previously tried to avoid an endoscopy.  No NSAID use.  Normal bowel movements.  Weight has been stable.  Stress seems to make the symptoms worse.  09/15/2020 ultrasound right upper quadrant shows no abnormality 09/03/2020 H. pylori stool antigen negative TTG IgA was negative but she had a low serum IgA level. 03/14/2021 hemoglobin 12.7  g 03/28/2021: H. pylori breath test negative.  Celiac serology negative. 04/18/2021 hemoglobin 12.7 g, B12 normal, iron studies normal.    Interval history   05/09/2021-09/08/2021  She still has pain during her periods and early morning nausea. Not yet seen her GYN. She has been diagnosed with POTTS. Says she is awaiting CT scan as for now .   Since her last visit she states she is neither better nor worse with her symptoms.  Her stress levels have not changed significantly.  However she does say that she has noticed that her abdominal pain is worse than during her periods.   Overall her symptoms are improved but still persist. IB guard didn't help.       Current Outpatient Medications  Medication Sig Dispense Refill   escitalopram (LEXAPRO) 5 MG tablet Take 1 tablet by mouth daily.     ibuprofen (ADVIL) 600 MG tablet Take 1 tablet (600 mg total) by mouth every 6 (six) hours as needed. 30 tablet 0   methocarbamol (ROBAXIN) 500 MG tablet Take 1 tablet (500 mg total) by mouth 2 (two) times daily as needed for muscle spasms. 10 tablet 0   ondansetron (ZOFRAN) 8 MG tablet Take by mouth.     rizatriptan (MAXALT) 5 MG tablet Take by mouth.     omeprazole (PRILOSEC) 40 MG capsule Take 1 capsule (40 mg total) by mouth in the morning and at bedtime. 90 capsule 3   No current facility-administered medications for this visit.  Allergies as of 09/08/2021 - Review Complete 09/08/2021  Allergen Reaction Noted   Amoxicillin Hives 08/31/2017   Augmentin [amoxicillin-pot clavulanate] Rash 04/20/2016   Codeine Nausea And Vomiting 07/18/2014   Flexeril [cyclobenzaprine] Tinitus 08/13/2014   Tylenol with codeine #3 [acetaminophen-codeine] Other (See Comments) 05/13/2013   Penicillins Hives 09/29/2020    Review of Systems:    All systems reviewed and negative except where noted in HPI.  General Appearance:    Alert, cooperative, no distress, appears stated age  Head:    Normocephalic, without obvious  abnormality, atraumatic  Eyes:    PERRL, conjunctiva/corneas clear,  Ears:    Grossly normal hearing    Neurologic:  Grossly normal    Observations/Objective:  Labs: CMP     Component Value Date/Time   NA 138 05/31/2020 1414   NA 139 02/05/2013 0948   K 3.6 05/31/2020 1414   K 3.7 02/05/2013 0948   CL 104 05/31/2020 1414   CL 105 02/05/2013 0948   CO2 24 05/31/2020 1414   CO2 30 (H) 02/05/2013 0948   GLUCOSE 81 05/31/2020 1414   GLUCOSE 72 02/05/2013 0948   BUN 23 (H) 05/31/2020 1414   BUN 11 02/05/2013 0948   CREATININE 0.76 05/31/2020 1414   CREATININE 0.55 (L) 02/05/2013 0948   CALCIUM 9.3 05/31/2020 1414   CALCIUM 9.4 02/05/2013 0948   PROT 7.7 05/31/2020 1414   ALBUMIN 4.4 05/31/2020 1414   AST 20 05/31/2020 1414   ALT 17 05/31/2020 1414   ALKPHOS 126 05/31/2020 1414   BILITOT 0.5 05/31/2020 1414   GFRNONAA >60 05/31/2020 1414   GFRAA >60 10/14/2019 1220   Lab Results  Component Value Date   WBC 5.8 06/13/2021   HGB 13.5 06/13/2021   HCT 40.7 06/13/2021   MCV 91.5 06/13/2021   PLT 240 06/13/2021    Imaging Studies: No results found.  Assessment and Plan:   Adriana Kerr is a 21 y.o. y/o female here to follow-up for dyspepsia.  Negative celiac serology and H. pylori breath test.  Completed a trial of Prilosec 40 mg twice a day.  No significant improvement with Prilosec.  Discussed that the neck step from the GI point of view would need to perform an EGD which she is not keen on at this point of time.  The fact that her abdominal pain gets worse during her.  It could indicate that she may have endometriosis.  Discussed options to pursue evaluation for endometriosis which she states she will get done after she has a CT scan which has been ordered by her doctor.  Not keen on any endoscopy as of now but will decide to do so based on results of her CT scan and GYN evaluation.  Down the road if required we could try a course of amitriptyline as a pain modulator.     I discussed the assessment and treatment plan with the patient. The patient was provided an opportunity to ask questions and all were answered. The patient agreed with the plan and demonstrated an understanding of the instructions.   The patient was advised to call back or seek an in-person evaluation if the symptoms worsen or if the condition fails to improve as anticipated.   I provided 11 minutes of face-to-face time during this encounter.     I discussed the assessment and treatment plan with the patient. The patient was provided an opportunity to ask questions and all were answered. The patient agreed with the plan and demonstrated  an understanding of the instructions.   The patient was advised to call back or seek an in-person evaluation if the symptoms worsen or if the condition fails to improve as anticipated.    Dr Wyline Mood MD,MRCP [facility]) Gastroenterology/Hepatology Pager: [phone]   Speech recognition software was used to dictate this note.

## 2021-09-09 ENCOUNTER — Other Ambulatory Visit: Payer: Self-pay

## 2021-09-09 DIAGNOSIS — D708 Other neutropenia: Secondary | ICD-10-CM

## 2021-09-11 ENCOUNTER — Other Ambulatory Visit: Payer: Self-pay | Admitting: *Deleted

## 2021-09-11 DIAGNOSIS — D708 Other neutropenia: Secondary | ICD-10-CM

## 2021-09-11 DIAGNOSIS — E538 Deficiency of other specified B group vitamins: Secondary | ICD-10-CM

## 2021-09-11 DIAGNOSIS — D509 Iron deficiency anemia, unspecified: Secondary | ICD-10-CM

## 2021-09-12 ENCOUNTER — Inpatient Hospital Stay: Payer: Medicaid Other | Attending: Oncology

## 2021-09-12 ENCOUNTER — Encounter: Payer: Self-pay | Admitting: Oncology

## 2021-09-12 ENCOUNTER — Inpatient Hospital Stay (HOSPITAL_BASED_OUTPATIENT_CLINIC_OR_DEPARTMENT_OTHER): Payer: Medicaid Other | Admitting: Oncology

## 2021-09-12 VITALS — BP 115/78 | HR 75 | Temp 98.5°F | Resp 16 | Ht 64.0 in | Wt 114.6 lb

## 2021-09-12 DIAGNOSIS — D509 Iron deficiency anemia, unspecified: Secondary | ICD-10-CM | POA: Diagnosis not present

## 2021-09-12 DIAGNOSIS — D708 Other neutropenia: Secondary | ICD-10-CM

## 2021-09-12 DIAGNOSIS — Z8041 Family history of malignant neoplasm of ovary: Secondary | ICD-10-CM | POA: Insufficient documentation

## 2021-09-12 DIAGNOSIS — Z803 Family history of malignant neoplasm of breast: Secondary | ICD-10-CM | POA: Diagnosis not present

## 2021-09-12 DIAGNOSIS — E538 Deficiency of other specified B group vitamins: Secondary | ICD-10-CM

## 2021-09-12 LAB — CBC WITH DIFFERENTIAL/PLATELET
Abs Immature Granulocytes: 0.02 10*3/uL (ref 0.00–0.07)
Basophils Absolute: 0 10*3/uL (ref 0.0–0.1)
Basophils Relative: 1 %
Eosinophils Absolute: 0.1 10*3/uL (ref 0.0–0.5)
Eosinophils Relative: 1 %
HCT: 39.6 % (ref 36.0–46.0)
Hemoglobin: 13.2 g/dL (ref 12.0–15.0)
Immature Granulocytes: 0 %
Lymphocytes Relative: 40 %
Lymphs Abs: 2.3 10*3/uL (ref 0.7–4.0)
MCH: 30.9 pg (ref 26.0–34.0)
MCHC: 33.3 g/dL (ref 30.0–36.0)
MCV: 92.7 fL (ref 80.0–100.0)
Monocytes Absolute: 0.4 10*3/uL (ref 0.1–1.0)
Monocytes Relative: 6 %
Neutro Abs: 3 10*3/uL (ref 1.7–7.7)
Neutrophils Relative %: 52 %
Platelets: 238 10*3/uL (ref 150–400)
RBC: 4.27 MIL/uL (ref 3.87–5.11)
RDW: 12 % (ref 11.5–15.5)
WBC: 5.7 10*3/uL (ref 4.0–10.5)
nRBC: 0 % (ref 0.0–0.2)

## 2021-09-12 LAB — IRON AND TIBC
Iron: 54 ug/dL (ref 28–170)
Saturation Ratios: 15 % (ref 10.4–31.8)
TIBC: 370 ug/dL (ref 250–450)
UIBC: 316 ug/dL

## 2021-09-12 LAB — FERRITIN: Ferritin: 26 ng/mL (ref 11–307)

## 2021-09-12 LAB — VITAMIN B12: Vitamin B-12: 281 pg/mL (ref 180–914)

## 2021-09-12 NOTE — Progress Notes (Signed)
Pt says that they have been working her up for ehlers-danlos syndrome, she is not having pain today but a lot of times has gi issues, one md has said it may be  endometriosis and she has an appt Dr. Jean Rosenthal in a few weeks

## 2021-09-12 NOTE — Progress Notes (Signed)
Hematology/Oncology Consult note [facility]  [phone] [phone]  Patient Care Team: [place], [place] [facility] as PCP - General (Family Medicine) Creig Hines, MD as Consulting Physician (Hematology and Oncology)   Name of the patient: Adriana Kerr  [id number]  Dec 13, 2000   Date of visit: 09/12/21  Diagnosis-iron deficiency anemia  Chief complaint/ Reason for visit-routine follow-up of iron deficiency anemia  Heme/Onc history: Patient is a 21 year old female with a history of iron deficiency anemia during pregnancy for which she received IV iron.  Subsequently she had some transient leukopenia/neutropenia which was being monitored  Interval history-patient currently feels better.  She is seeing GI for her symptoms of abdominal pain.  Was also diagnosed with possible POTS but has not seen cardiology in a while.  Gets occasional bruising  ECOG PS- 0 Pain scale- 0  Review of systems- Review of Systems  Constitutional:  Negative for chills, fever, malaise/fatigue and weight loss.  HENT:  Negative for congestion, ear discharge and nosebleeds.   Eyes:  Negative for blurred vision.  Respiratory:  Negative for cough, hemoptysis, sputum production, shortness of breath and wheezing.   Cardiovascular:  Negative for chest pain, palpitations, orthopnea and claudication.  Gastrointestinal:  Negative for abdominal pain, blood in stool, constipation, diarrhea, heartburn, melena, nausea and vomiting.  Genitourinary:  Negative for dysuria, flank pain, frequency, hematuria and urgency.  Musculoskeletal:  Negative for back pain, joint pain and myalgias.  Skin:  Negative for rash.  Neurological:  Negative for dizziness, tingling, focal weakness, seizures, weakness and headaches.  Endo/Heme/Allergies:  Does not bruise/bleed easily.  Psychiatric/Behavioral:  Negative for depression and suicidal ideas. The patient does not have insomnia.        Allergies  Allergen Reactions   Amoxicillin Hives   Augmentin [Amoxicillin-Pot Clavulanate] Rash   Codeine Nausea And Vomiting   Flexeril [Cyclobenzaprine] Tinitus    Agitation and disorientation, muscle cramps   Tylenol With Codeine #3 [Acetaminophen-Codeine] Other (See Comments)    emesis   Penicillins Hives    Happened while on amoxicillin, ER MD said she should avoid all penicillin      Past Medical History:  Diagnosis Date   Anxiety    Depression    Migraines    PONV (postoperative nausea and vomiting)    Postural orthostatic tachycardia syndrome      Past Surgical History:  Procedure Laterality Date   ADENOIDECTOMY     ELBOW SURGERY     KNEE SURGERY     TONSILLECTOMY      Social History   Socioeconomic History   Marital status: Married    Spouse name: [name]    Number of children: Not on file   Years of education: Not on file   Highest education level: Not on file  Occupational History   Not on file  Tobacco Use   Smoking status: Never    Passive exposure: Yes   Smokeless tobacco: Never  Vaping Use   Vaping Use: Never used  Substance and Sexual Activity   Alcohol use: No   Drug use: Yes    Types: Marijuana    Comment: Last use 04/29/20   Sexual activity: Yes    Birth control/protection: None  Other Topics Concern   Not on file  Social History Narrative   Not on file   Social Determinants of Health   Financial Resource Strain: Not on file  Food Insecurity: Not on file  Transportation Needs: Not on file  Physical Activity: Not on file  Stress: Not on file  Social Connections: Not on file  Intimate Partner Violence: Not on file    Family History  Problem Relation Age of Onset   Diabetes type II Paternal Grandfather    Ovarian cancer Maternal Grandmother    Migraines Maternal Grandmother    Breast cancer Paternal Grandmother 86   Ulcers Mother    Fibromyalgia Mother    Migraines Mother    Drug abuse Mother    Thyroid disease Father     Alcohol abuse Father    Migraines Sister    Turner syndrome Maternal Aunt    Crohn's disease Maternal Aunt    Stroke Maternal Aunt    Arthritis Maternal Aunt      Current Outpatient Medications:    escitalopram (LEXAPRO) 5 MG tablet, Take 1 tablet by mouth daily., Disp: , Rfl:    ibuprofen (ADVIL) 600 MG tablet, Take 1 tablet (600 mg total) by mouth every 6 (six) hours as needed., Disp: 30 tablet, Rfl: 0   ondansetron (ZOFRAN) 8 MG tablet, Take by mouth., Disp: , Rfl:    rizatriptan (MAXALT) 5 MG tablet, Take by mouth. (Patient not taking: Reported on 09/12/2021), Disp: , Rfl:   Physical exam:  Vitals:   09/12/21 1324  BP: 115/78  Pulse: 75  Resp: 16  Temp: 98.5 F (36.9 C)  TempSrc: Oral  Weight: 114 lb 9.6 oz (52 kg)  Height: 5\' 4"  (1.626 m)   Physical Exam Constitutional:      General: She is not in acute distress. Cardiovascular:     Rate and Rhythm: Normal rate and regular rhythm.     Heart sounds: Normal heart sounds.  Pulmonary:     Effort: Pulmonary effort is normal.     Breath sounds: Normal breath sounds.  Abdominal:     General: Bowel sounds are normal.     Palpations: Abdomen is soft.  Skin:    General: Skin is warm and dry.  Neurological:     Mental Status: She is alert and oriented to person, place, and time.         Latest Ref Rng & Units 05/31/2020    2:14 PM  CMP  Glucose 70 - 99 mg/dL 81   BUN 6 - 20 mg/dL 23   Creatinine 07/31/2020 - 1.00 mg/dL 1.24   Sodium 5.80 - 998 mmol/L 138   Potassium 3.5 - 5.1 mmol/L 3.6   Chloride 98 - 111 mmol/L 104   CO2 22 - 32 mmol/L 24   Calcium 8.9 - 10.3 mg/dL 9.3   Total Protein 6.5 - 8.1 g/dL 7.7   Total Bilirubin 0.3 - 1.2 mg/dL 0.5   Alkaline Phos 38 - 126 U/L 126   AST 15 - 41 U/L 20   ALT 0 - 44 U/L 17       Latest Ref Rng & Units 09/12/2021   12:56 PM  CBC  WBC 4.0 - 10.5 K/uL 5.7   Hemoglobin 12.0 - 15.0 g/dL 09/14/2021   Hematocrit 25.0 - 46.0 % 39.6   Platelets 150 - 400 K/uL 238       Assessment and plan- Patient is a 21 y.o. female with history of iron deficiency anemia here for routine follow-up  Patient is not currently anemic but her ferritin is running low at 26. Iron saturation borderline low at 15%. I would like her to continue oral iron every other day at this time.  I am holding off  on any IV iron. She does not require any further follow-up for the knee and can continue to follow-up with her primary care doctor.  She can be referred to [place] in the future if questions or concerns arise.  Leukopenia: Transient and has now resolved.  Patient was concerned that she may have Ehlers-Danlos syndrome.  States that she was quite flexible at her joints prior to her pregnancy.  On my exam today I do not notice any hyperextensibility in her joints.   Visit Diagnosis 1. Iron deficiency anemia, unspecified iron deficiency anemia type      Dr. Owens Shark, MD, MPH [facility] at [facility] [phone] 09/12/2021 4:17 PM

## 2021-09-13 ENCOUNTER — Other Ambulatory Visit: Payer: Self-pay | Admitting: *Deleted

## 2021-09-13 DIAGNOSIS — D509 Iron deficiency anemia, unspecified: Secondary | ICD-10-CM

## 2021-09-13 NOTE — Progress Notes (Signed)
Pt prefers to not take b12 nor iron supplement due to nausea and makes her very sick. Dr. Smith Robert is okay with infusions pt is aware and will check appoinments on MyChart.

## 2021-09-21 ENCOUNTER — Inpatient Hospital Stay: Payer: Medicaid Other | Attending: Oncology

## 2021-09-21 ENCOUNTER — Telehealth: Payer: Self-pay | Admitting: *Deleted

## 2021-09-21 ENCOUNTER — Other Ambulatory Visit: Payer: Self-pay | Admitting: Oncology

## 2021-09-21 VITALS — BP 100/58 | HR 55 | Temp 97.4°F | Resp 17

## 2021-09-21 DIAGNOSIS — D508 Other iron deficiency anemias: Secondary | ICD-10-CM

## 2021-09-21 DIAGNOSIS — E538 Deficiency of other specified B group vitamins: Secondary | ICD-10-CM | POA: Insufficient documentation

## 2021-09-21 DIAGNOSIS — D509 Iron deficiency anemia, unspecified: Secondary | ICD-10-CM | POA: Diagnosis present

## 2021-09-21 MED ORDER — CYANOCOBALAMIN 1000 MCG/ML IJ SOLN
1000.0000 ug | INTRAMUSCULAR | Status: DC
  Administered 2021-09-21: 1000 ug via INTRAMUSCULAR
  Filled 2021-09-21: qty 1

## 2021-09-21 MED ORDER — SODIUM CHLORIDE 0.9 % IV SOLN
Freq: Once | INTRAVENOUS | Status: AC
  Filled 2021-09-21: qty 250

## 2021-09-21 MED ORDER — SODIUM CHLORIDE 0.9 % IV SOLN
510.0000 mg | INTRAVENOUS | Status: DC
  Administered 2021-09-21: 510 mg via INTRAVENOUS
  Filled 2021-09-21: qty 17

## 2021-09-21 MED ORDER — CYANOCOBALAMIN 1000 MCG/ML IJ SOLN: 1000.0000 ug | INTRAMUSCULAR | 11 refills | Status: DC

## 2021-09-21 MED ORDER — "SYRINGE 25G X 1"" 3 ML MISC": 1.0000 | 0 refills | Status: DC

## 2021-09-21 NOTE — Telephone Encounter (Signed)
I spoke to the pt. And wanted to see if she wants to start getting b12 inj monthly here or at at home. The pt. Says she knows how to give a shot and her husband could help. I will send the rx and syringe for her

## 2021-09-28 ENCOUNTER — Inpatient Hospital Stay: Payer: Medicaid Other

## 2021-09-28 VITALS — BP 96/68 | HR 82 | Temp 97.7°F

## 2021-09-28 DIAGNOSIS — D509 Iron deficiency anemia, unspecified: Secondary | ICD-10-CM | POA: Diagnosis not present

## 2021-09-28 DIAGNOSIS — D508 Other iron deficiency anemias: Secondary | ICD-10-CM

## 2021-09-28 MED ORDER — SODIUM CHLORIDE 0.9 % IV SOLN
510.0000 mg | INTRAVENOUS | Status: DC
  Administered 2021-09-28: 510 mg via INTRAVENOUS
  Filled 2021-09-28: qty 510

## 2021-09-28 MED ORDER — CYANOCOBALAMIN 1000 MCG/ML IJ SOLN
1000.0000 ug | INTRAMUSCULAR | Status: DC
  Administered 2021-09-28: 1000 ug via INTRAMUSCULAR

## 2021-09-28 MED ORDER — SODIUM CHLORIDE 0.9 % IV SOLN
Freq: Once | INTRAVENOUS | Status: AC
  Filled 2021-09-28: qty 250

## 2021-09-28 NOTE — Patient Instructions (Signed)

## 2021-12-14 ENCOUNTER — Inpatient Hospital Stay: Payer: Medicaid Other | Attending: Oncology

## 2021-12-14 DIAGNOSIS — D509 Iron deficiency anemia, unspecified: Secondary | ICD-10-CM | POA: Diagnosis not present

## 2021-12-14 LAB — CBC
HCT: 38.2 % (ref 36.0–46.0)
Hemoglobin: 12.7 g/dL (ref 12.0–15.0)
MCH: 31.1 pg (ref 26.0–34.0)
MCHC: 33.2 g/dL (ref 30.0–36.0)
MCV: 93.6 fL (ref 80.0–100.0)
Platelets: 176 10*3/uL (ref 150–400)
RBC: 4.08 MIL/uL (ref 3.87–5.11)
RDW: 12 % (ref 11.5–15.5)
WBC: 3.8 10*3/uL — ABNORMAL LOW (ref 4.0–10.5)
nRBC: 0 % (ref 0.0–0.2)

## 2021-12-14 LAB — IRON AND TIBC
Iron: 91 ug/dL (ref 28–170)
Saturation Ratios: 33 % — ABNORMAL HIGH (ref 10.4–31.8)
TIBC: 279 ug/dL (ref 250–450)
UIBC: 188 ug/dL

## 2021-12-14 LAB — FERRITIN: Ferritin: 190 ng/mL (ref 11–307)

## 2021-12-14 IMAGING — US US ABDOMEN LIMITED
1 series · 14 of 25 positions shown · non-contrast
Comparison: None.

CLINICAL DATA: Right upper quadrant and epigastric pain

Nausea
GERD
EXAM:
ULTRASOUND ABDOMEN LIMITED RIGHT UPPER QUADRANT

[Series 1: us abdomen limited · 0.17mm/px · 14 of 26 slices shown]
[im 1/26]
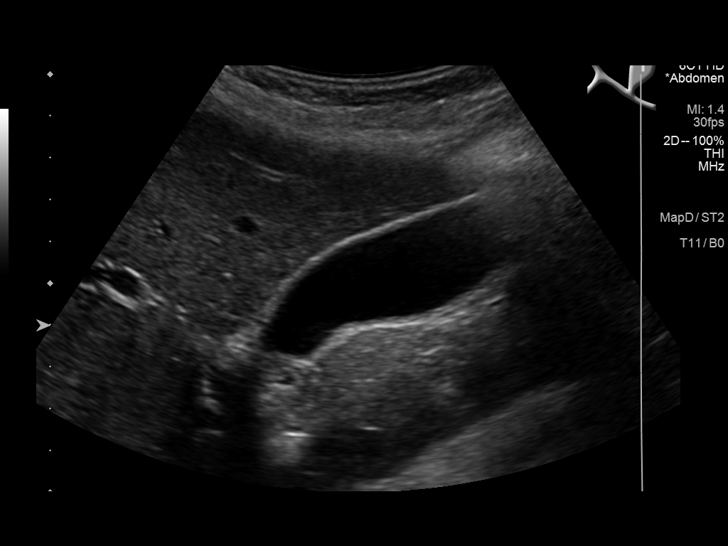
[im 3/26]
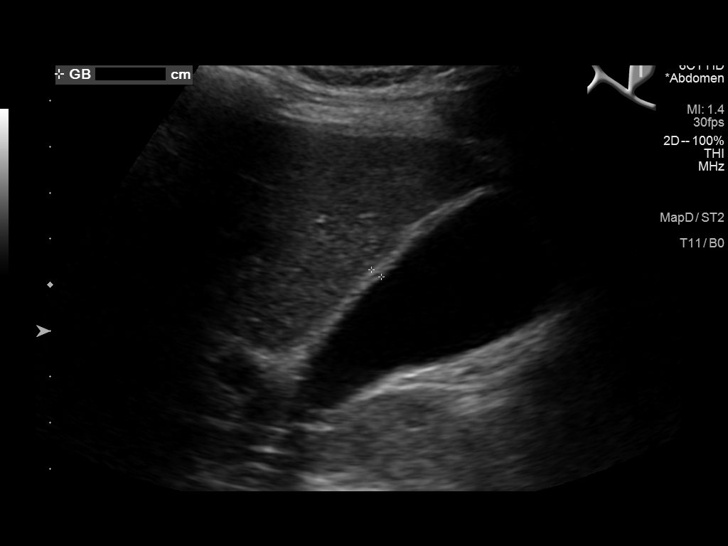
[im 5/26]
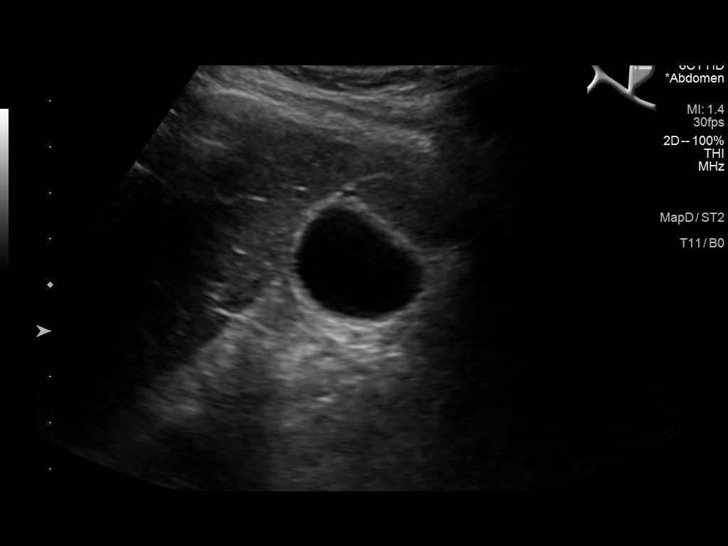
[im 7/26]
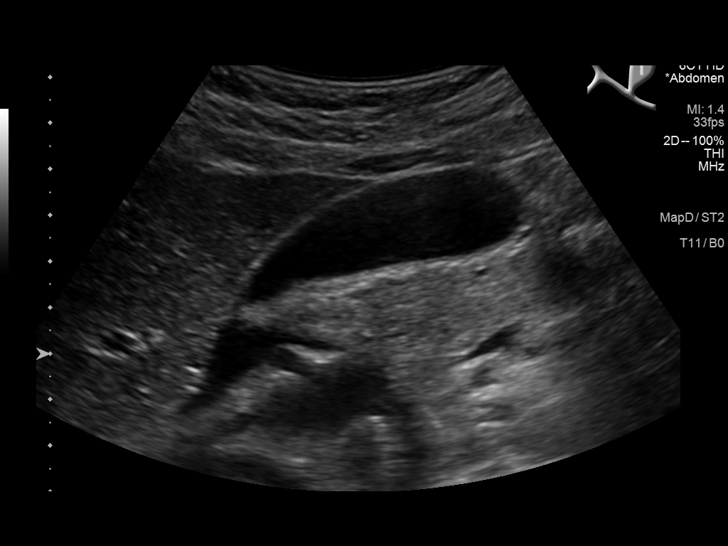
[im 9/26]
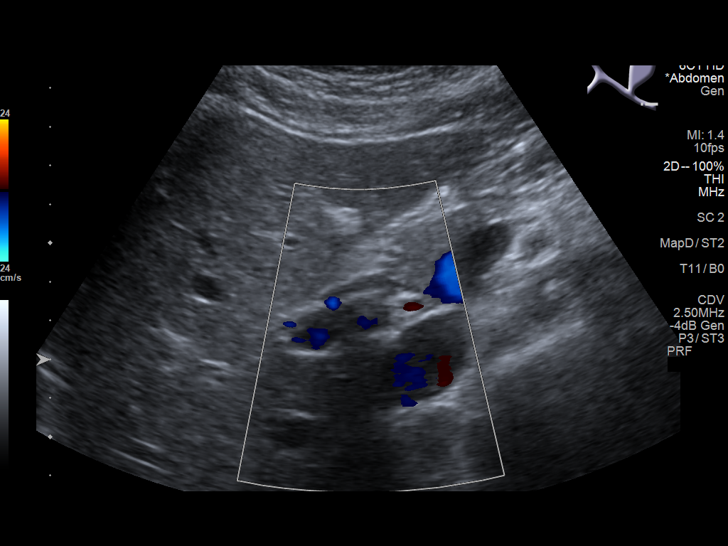
[im 10/26]
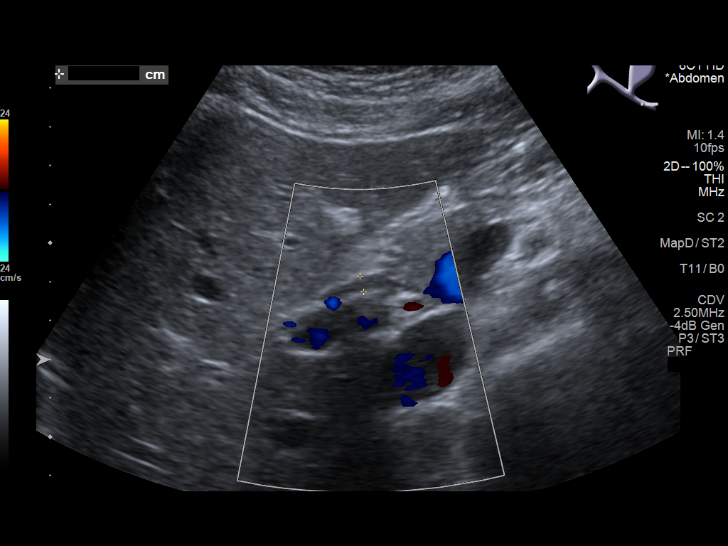
[im 12/26]
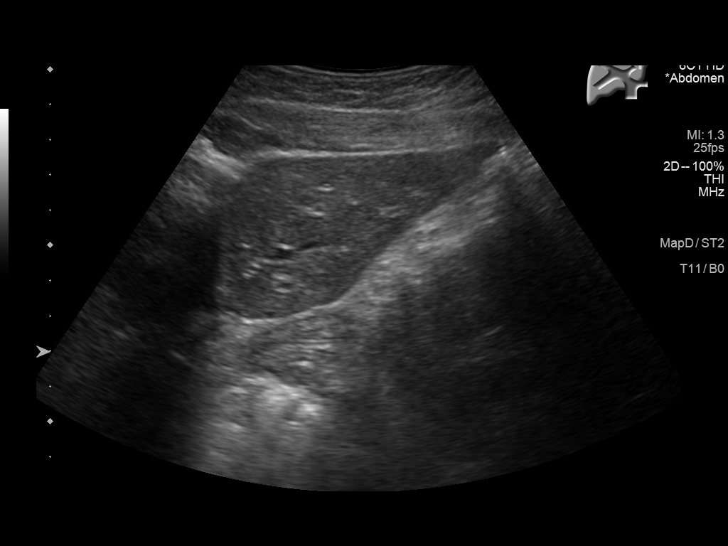
[im 14/26]
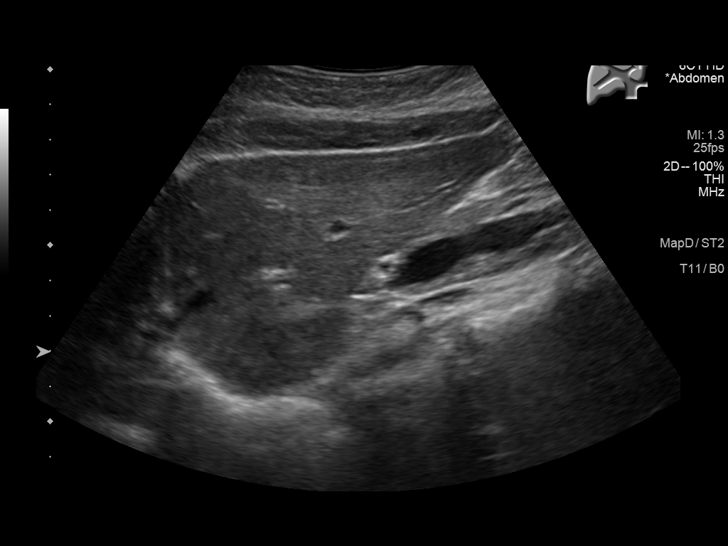
[im 16/26]
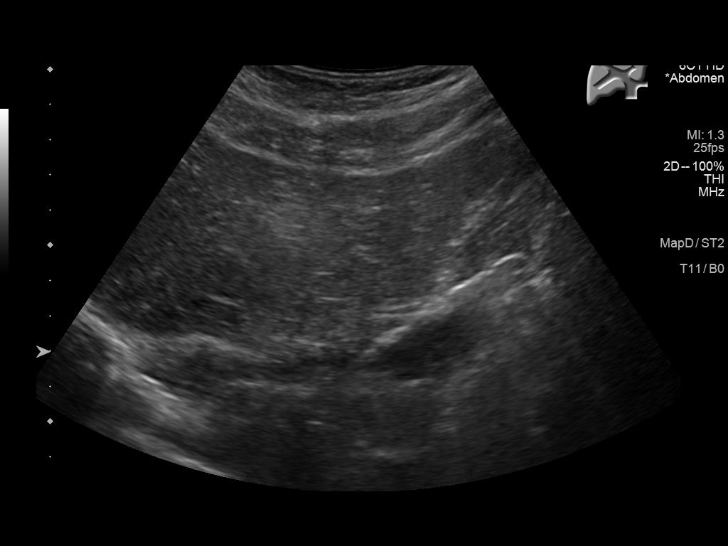
[im 17/26]
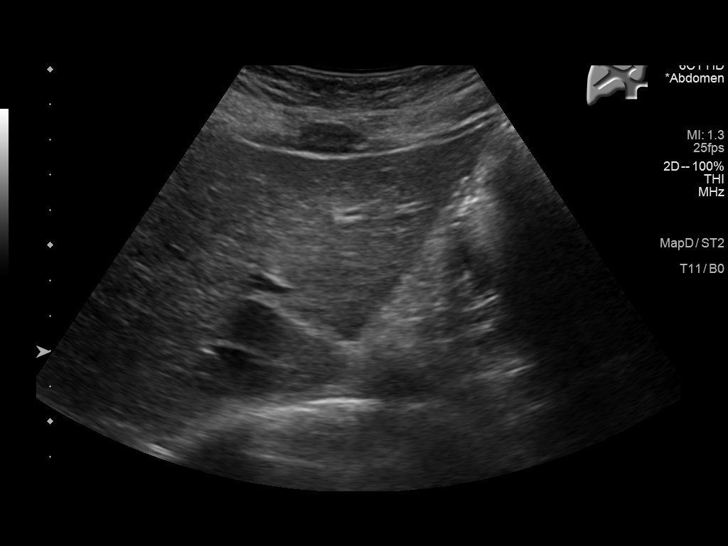
[im 19/26]
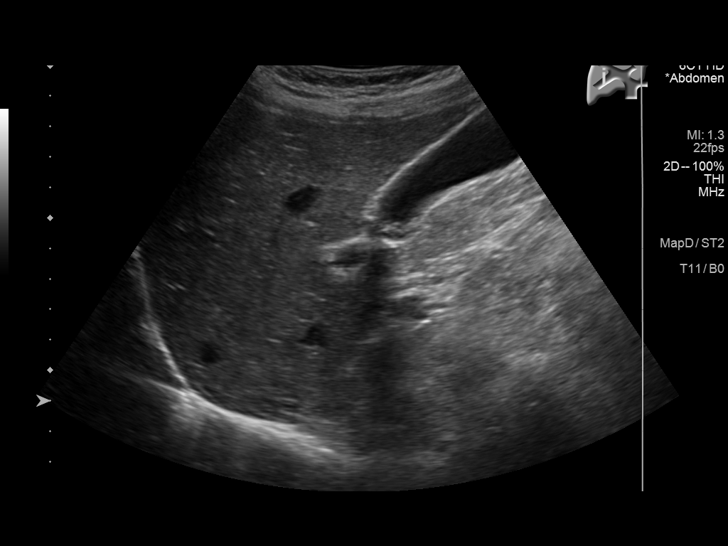
[im 21/26]
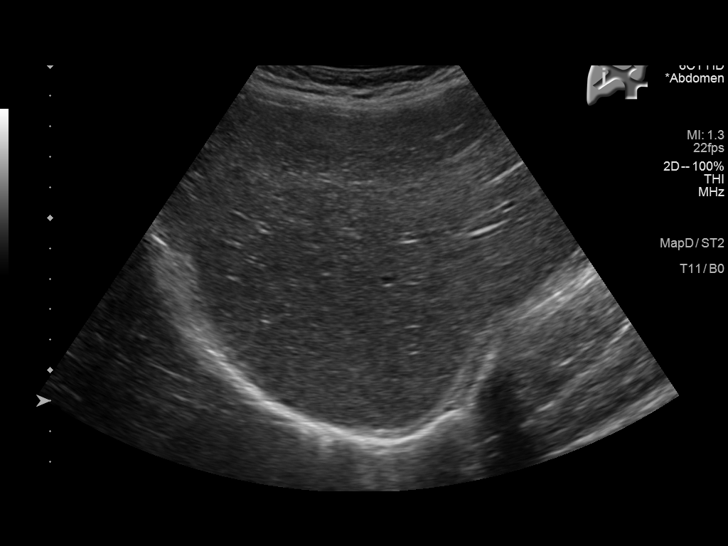
[im 23/26]
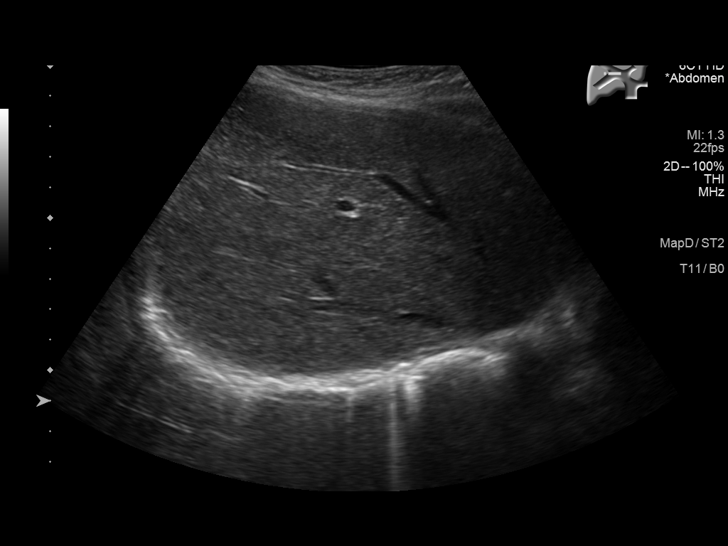
[im 26/26]
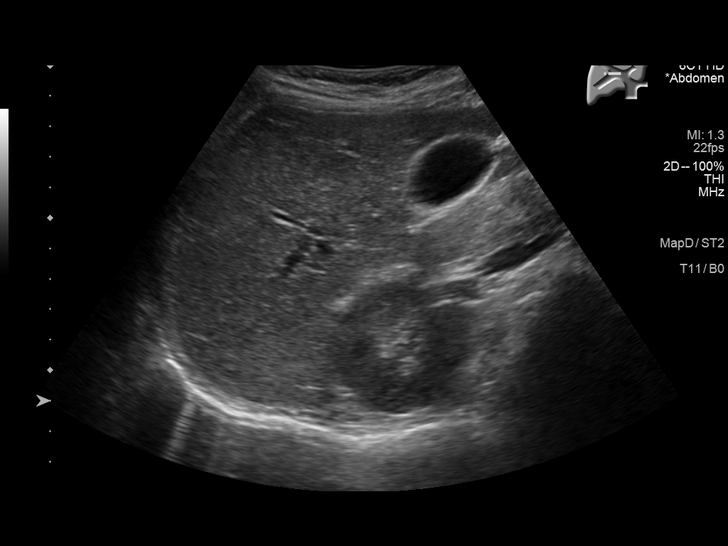

[14 of 25 positions shown; findings below may reference images not displayed]

FINDINGS: Gallbladder:

No gallstones or wall thickening visualized. No sonographic Murphy
sign noted by sonographer.

Common bile duct:

Diameter: 4 mm

Liver:

No focal lesion identified. Within normal limits in parenchymal
echogenicity. Portal vein is patent on color Doppler imaging with
normal direction of blood flow towards the liver.

Other: None.
IMPRESSION: No significant sonographic abnormality of the liver or gallbladder

## 2021-12-15 ENCOUNTER — Telehealth: Payer: Self-pay | Admitting: *Deleted

## 2021-12-15 NOTE — Telephone Encounter (Signed)
Patient called reporting hat her labs have come back abnormal drawn yesterday and that her WBC is down again. She states that she is scheduled for Tubal Ligation in Jan by Dr Jean Rosenthal and she is concerned that her abnormal labs will cause her problems with healing or infection and would like to discuss this with doctor  CBC Order: [id number] Status: Final result    Visible to patient: Yes (seen)    Next appt: 02/23/2022 at 09:00 AM in Pre-Admission Testing ([facility])    Dx: Iron deficiency anemia, unspecified i...    0 Result Notes           Component Ref Range & Units 1 d ago 3 mo ago 6 mo ago 8 mo ago 9 mo ago 11 mo ago 1 yr ago  WBC 4.0 - 10.5 K/uL 3.8 Low   5.7  5.8  3.3 Low   3.5 Low   5.9  3.5 Low    RBC 3.87 - 5.11 MIL/uL 4.08  4.27  4.45  4.14  4.16  4.14  3.97   Hemoglobin 12.0 - 15.0 g/dL 78.2  95.6  21.3  08.6  12.7  12.5  12.4   HCT 36.0 - 46.0 % 38.2  39.6  40.7  37.1  38.2  37.8  36.1   MCV 80.0 - 100.0 fL 93.6  92.7  91.5  89.6  91.8  91.3  90.9   MCH 26.0 - 34.0 pg 31.1  30.9  30.3  30.7  30.5  30.2  31.2   MCHC 30.0 - 36.0 g/dL 57.8  46.9  62.9  52.8  33.2  33.1  34.3   RDW 11.5 - 15.5 % 12.0  12.0  11.9  12.1  12.1  11.8  12.1   Platelets 150 - 400 K/uL 176  238  240  169  202  268  197   nRBC 0.0 - 0.2 % 0.0  0.0  0.0  0.0 CM  0.0  0.0  0.0   Comment: Performed at [facility], [address] [place], [place] [address]  Neutrophils Relative %   52 R  53 R   36 R  64 R  42 R   Basophils Absolute   0.0 R  0.1 R   0.0 R  0.1 R  0.0 R   Immature Granulocytes   0 R  0 R   0 R  0 R  0 R   Abs Immature Granulocytes   0.02 R, CM  0.01 R, CM   0.00 R, CM  0.02 R, CM  0.01 R, CM   Neutro Abs   3.0 R  3.1 R   1.3 Low  R  3.7 R  1.5 Low  R   Lymphocytes Relative   40 R  38 R   57 R  31 R  49 R   Lymphs Abs   2.3 R  2.2 R   2.0 R  1.8 R  1.7 R   Monocytes Relative   6 R  7 R   5 R  4 R  7 R   Monocytes Absolute   0.4 R  0.4 R   0.2 R  0.3 R  0.2 R   Eosinophils  Relative   1 R  1 R   1 R  0 R  1 R   Eosinophils Absolute   0.1 R  0.0 R   0.0 R  0.0 R  0.0 R   Basophils Relative   1 R  1 R   1 R  1 R  1 R   Resulting Agency  [facility]         Specimen Collected: 12/14/21 13:38 Last Resulted: 12/14/21 13:49      Lab Flowsheet    Order Details    View Encounter    Lab and Collection Details    Routing    Result History    View All Conversations on this Encounter      CM=Additional comments  R=Reference range differs from displayed range      Result Care Coordination   Patient Communication   Add Comments   Seen Back to Top       Other Results from 12/14/2021  Ferritin Order: [id number] Status: Final result    Visible to patient: Yes (seen)    Next appt: 02/23/2022 at 09:00 AM in Pre-Admission Testing ([facility])    Dx: Iron deficiency anemia, unspecified i...    0 Result Notes           Component Ref Range & Units 1 d ago 3 mo ago 6 mo ago 8 mo ago 9 mo ago 11 mo ago 1 yr ago  Ferritin 11 - 307 ng/mL 190  26 CM  30 CM  34 CM  41 CM  51 CM  96 CM   Comment: Performed at [facility], [address] [place], [place] [address]  Resulting Agency  [facility]         Specimen Collected: 12/14/21 13:38 Last Resulted: 12/14/21 14:45      Lab Flowsheet    Order Details    View Encounter    Lab and Collection Details    Routing    Result History    View All Conversations on this Encounter      CM=Additional comments      Result Care Coordination   Patient Communication   Add Comments   Seen Back to Top          Contains abnormal data Iron and TIBC Order: [id number] Status: Final result    Visible to patient: Yes (seen)    Next appt: 02/23/2022 at 09:00 AM in Pre-Admission Testing ([facility])    Dx: Iron deficiency anemia, unspecified i...    0  Result Notes           Component Ref Range & Units 1 d ago 3 mo ago 6 mo ago 8 mo ago 9 mo ago 11 mo ago 1 yr ago  Iron 28 - 170 ug/dL 91  54  96  61  465 High   89  118   TIBC 250 - 450 ug/dL 035  465  681  275  170  326  353   Saturation Ratios 10.4 - 31.8 % 33 High   15  26  19   49 High   27  33 High    UIBC ug/dL  017 CM  494 CM  496 CM  179 CM  237 CM  235 CM   Comment: Performed at [facility], [address] [place], [place]  Resulting Agency  [facility]  [place] [facility] [place] [facility] [place] [facility] [place] [facility]         Specimen Collected: 12/14/21 13:38 Last Resulted: 12/14/21 14:45

## 2021-12-16 ENCOUNTER — Telehealth: Payer: Self-pay | Admitting: *Deleted

## 2021-12-16 NOTE — Telephone Encounter (Signed)
Video visit on Thursday morning

## 2021-12-16 NOTE — Telephone Encounter (Signed)
I called the pt and let her know that the lab was cbc and dr. Janese Banks wanted to add the diff to the lab but the lab says it was sitting to long to add the diff. Per Janese Banks the pt. Always has over 1 with ANC so she will be good for surgery. The pt. Then says that she has spoke to someone else and was telling her that she is exhausted all the time. It can go up and down. She wants phone call from Janese Banks about what is she going to do to find out what makes the counts go up and down. What about her exhaustion. I went back to Janese Banks and she says that the exhaustion has nothing to do with her wbc counts. There are times that we do not have the answer as to why the numbers go up and down.she is going to have a telephone call next Thursday at 9:30 am/ pt agrees with the appt and was frustrated that the message sent to our team did not go through all staff at same time. I apologized for that and she will speak to Janese Banks next week

## 2021-12-16 NOTE — Telephone Encounter (Signed)
Add differential on yesterdays cbc please. You can let her know that her anc has always been >1 and it should not affect her upcoming surgery

## 2021-12-19 ENCOUNTER — Encounter: Payer: Self-pay | Admitting: Oncology

## 2021-12-22 ENCOUNTER — Telehealth: Payer: Self-pay | Admitting: *Deleted

## 2021-12-22 ENCOUNTER — Inpatient Hospital Stay: Payer: Medicaid Other | Attending: Oncology | Admitting: Oncology

## 2021-12-22 DIAGNOSIS — D509 Iron deficiency anemia, unspecified: Secondary | ICD-10-CM | POA: Diagnosis not present

## 2021-12-22 DIAGNOSIS — D708 Other neutropenia: Secondary | ICD-10-CM

## 2021-12-25 ENCOUNTER — Encounter: Payer: Self-pay | Admitting: Oncology

## 2021-12-25 NOTE — Progress Notes (Signed)
I connected with Adriana Kerr on 12/25/21 at  9:30 AM EDT by telephone visit and verified that I am speaking with the correct person using two identifiers.   I discussed the limitations, risks, security and privacy concerns of performing an evaluation and management service by telemedicine and the availability of in-person appointments. I also discussed with the patient that there may be a patient responsible charge related to this service. The patient expressed understanding and agreed to proceed.  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  Subsequently she had some transient leukopenia/neutropenia which is being monitored.  She has not required a bone marrow biopsy for the same.  Interval history patient is presently frustrated that no one has been able to pinpoint as to why she has been feeling so fatigued despite being nearly 1 year out of her pregnancy.  She does experience on and off joint pains.  She feels that she has on and off numbness over the area of her shoulder.  Denies any fever or recurrent infections.   Review of Systems  Constitutional:  Positive for malaise/fatigue. Negative for chills, fever and weight loss.  HENT:  Negative for congestion, ear discharge and nosebleeds.   Eyes:  Negative for blurred vision.  Respiratory:  Negative for cough, hemoptysis, sputum production, shortness of breath and wheezing.   Cardiovascular:  Negative for chest pain, palpitations, orthopnea and claudication.  Gastrointestinal:  Negative for abdominal pain, blood in stool, constipation, diarrhea, heartburn, melena, nausea and vomiting.  Genitourinary:  Negative for dysuria, flank pain, frequency, hematuria  and urgency.  Musculoskeletal:  Positive for joint pain. Negative for back pain and myalgias.  Skin:  Negative for rash.  Neurological:  Negative for dizziness, tingling, focal weakness, seizures, weakness and headaches.  Endo/Heme/Allergies:  Does not bruise/bleed easily.  Psychiatric/Behavioral:  Negative for depression and suicidal ideas. The patient does not have insomnia.     Allergies  Allergen Reactions   Amoxicillin Hives   Augmentin [Amoxicillin-Pot Clavulanate] Rash   Codeine Nausea And Vomiting   Flexeril [Cyclobenzaprine] Tinitus    Agitation and disorientation, muscle cramps   Tylenol With Codeine #3 [Acetaminophen-Codeine] Other (See Comments)    emesis   Penicillins Hives    Happened while on amoxicillin, ER MD said she should avoid all penicillin     Past Medical History:  Diagnosis Date   Anxiety    Depression    Migraines    PONV (postoperative nausea and vomiting)    Postural orthostatic tachycardia syndrome     Past Surgical History:  Procedure Laterality Date   ADENOIDECTOMY     ELBOW SURGERY     KNEE SURGERY     TONSILLECTOMY      Social History   Socioeconomic History   Marital status: Married    Spouse name: Thurmond Butts    Number of children: Not on file   Years of education: Not on file   Highest education level: Not on file  Occupational History   Not on file  Tobacco Use   Smoking status: Never    Passive exposure: Yes   Smokeless tobacco: Never  Vaping Use   Vaping Use: Never used  Substance and Sexual Activity   Alcohol use:  No   Drug use: Yes    Types: Marijuana    Comment: Last use 04/29/20   Sexual activity: Yes    Birth control/protection: None  Other Topics Concern   Not on file  Social History Narrative   Not on file   Social Determinants of Health   Financial Resource Strain: Not on file  Food Insecurity: Not on file  Transportation Needs: Not on file  Physical Activity: Not on file  Stress: Not on file  Social  Connections: Not on file  Intimate Partner Violence: Not on file    Family History  Problem Relation Age of Onset   Diabetes type II Paternal Grandfather    Ovarian cancer Maternal Grandmother    Migraines Maternal Grandmother    Breast cancer Paternal Grandmother 55   Ulcers Mother    Fibromyalgia Mother    Migraines Mother    Drug abuse Mother    Thyroid disease Father    Alcohol abuse Father    Migraines Sister    Turner syndrome Maternal Aunt    Crohn's disease Maternal Aunt    Stroke Maternal Aunt    Arthritis Maternal Aunt      Current Outpatient Medications:    cyanocobalamin (VITAMIN B12) 1000 MCG/ML injection, Inject 1 mL (1,000 mcg total) into the muscle every 30 (thirty) days., Disp: 1 mL, Rfl: 11   escitalopram (LEXAPRO) 5 MG tablet, Take 1 tablet by mouth daily., Disp: , Rfl:    ibuprofen (ADVIL) 600 MG tablet, Take 1 tablet (600 mg total) by mouth every 6 (six) hours as needed., Disp: 30 tablet, Rfl: 0   ondansetron (ZOFRAN) 8 MG tablet, Take by mouth., Disp: , Rfl:    rizatriptan (MAXALT) 5 MG tablet, Take by mouth. (Patient not taking: Reported on 09/12/2021), Disp: , Rfl:    Syringe/Needle, Disp, (SYRINGE 3CC/25GX1") 25G X 1" 3 ML MISC, 1 Syringe by Does not apply route every 30 (thirty) days., Disp: 12 each, Rfl: 0  No results found.  No images are attached to the encounter.      Latest Ref Rng & Units 05/31/2020    2:14 PM  CMP  Glucose 70 - 99 mg/dL 81   BUN 6 - 20 mg/dL 23   Creatinine 0.44 - 1.00 mg/dL 0.76   Sodium 135 - 145 mmol/L 138   Potassium 3.5 - 5.1 mmol/L 3.6   Chloride 98 - 111 mmol/L 104   CO2 22 - 32 mmol/L 24   Calcium 8.9 - 10.3 mg/dL 9.3   Total Protein 6.5 - 8.1 g/dL 7.7   Total Bilirubin 0.3 - 1.2 mg/dL 0.5   Alkaline Phos 38 - 126 U/L 126   AST 15 - 41 U/L 20   ALT 0 - 44 U/L 17       Latest Ref Rng & Units 12/14/2021    1:38 PM  CBC  WBC 4.0 - 10.5 K/uL 3.8   Hemoglobin 12.0 - 15.0 g/dL 12.7   Hematocrit 36.0 - 46.0  % 38.2   Platelets 150 - 400 K/uL 176     Assessment and plan: Patient is a 21 year old female who sees me for history of iron deficiency anemia and leukopenia  Leukopenia/ neutropenia: Patient has been having waxing and waning low white cell counts between 3.5-6 over the last 1 year without a clear downward trend.  Differential diagnosis has shown mild neutropenia in the past with an [place] that fluctuates between 1.3-3.  Hemoglobin and platelets are within normal  limits.  Patient does not get any recurrent infections.  She therefore does not require any bone marrow biopsy from a neutropenia standpoint and it is unclear if her neutropenia is potentially autoimmune.  Regardless it is mild and can be monitored conservatively.  This should not preclude her from getting her surgery for endometriosis.  Also her mildly low white cell count does not explain her fatigue which is the patient's biggest concern.  Iron deficiency anemia: Patient is not presently anemic with a hemoglobin that fluctuates betweenIn 12-13.  Ferritin levels are presently normal at 198 with an iron saturation of 33% and not indicative of iron deficiency.  Therefore her fatigue is not explained by iron deficiency.  She will keep her lab appointment and follow-up with me in January as scheduled but we will plan to check her labs right before she goes for endometriosis surgery.  Presently patient's biggest concern is her fatigueWhich remains unexplained.  She does have symptoms of anxiety and depression for which she is on medications and states that her symptoms are under good control.  She is also being worked up by cardiology for possible POTS.  However patient complains of on and off joint pains as well as numbness over her shoulder.  She has chronic fatigue and aches and pains all over.  She was ANA positive in the past.  I am therefore referring her to rheumatology to see if there is a component of fibromyalgia which is contributing to  her symptoms.  As such I am unable to explain her symptoms from a hematology standpoint  Follow-up instructions: Refer to rheumatology and keep follow-up appointment with me as planned  I discussed the assessment and treatment plan with the patient. The patient was provided an opportunity to ask questions and all were answered. The patient agreed with the plan and demonstrated an understanding of the instructions.   The patient was advised to call back or seek an in-person evaluation if the symptoms worsen or if the condition fails to improve as anticipated.  I provided 16 minutes of non face-to-face telephone visit time during this encounter, and > 50% was spent counseling as documented under my assessment & plan.  Visit Diagnosis: 1. Other neutropenia ([place])   2. Iron deficiency anemia, unspecified iron deficiency anemia type     Dr. Randa Evens, MD, MPH [place] at [facility] Tel- [phone] 12/25/2021 8:00 PM

## 2022-01-26 ENCOUNTER — Telehealth: Payer: Self-pay

## 2022-01-26 NOTE — Telephone Encounter (Signed)
Reached out to [facility] to check on pts referral for severe fatigued faxed on 12/28/2021 per their office pt has been scheudled for 04/19/2022 at 3:15pm.

## 2022-02-01 ENCOUNTER — Other Ambulatory Visit: Payer: Self-pay | Admitting: *Deleted

## 2022-02-23 ENCOUNTER — Other Ambulatory Visit: Payer: Medicaid Other

## 2022-03-03 ENCOUNTER — Ambulatory Visit: Admit: 2022-03-03 | Payer: Medicaid Other | Admitting: Obstetrics and Gynecology

## 2022-03-03 SURGERY — LAPAROSCOPY, DIAGNOSTIC, ROBOT-ASSISTED
Anesthesia: Choice | Laterality: Bilateral

## 2022-03-15 ENCOUNTER — Inpatient Hospital Stay: Payer: Medicaid Other | Attending: Oncology

## 2022-03-15 DIAGNOSIS — D509 Iron deficiency anemia, unspecified: Secondary | ICD-10-CM | POA: Diagnosis present

## 2022-03-15 DIAGNOSIS — D709 Neutropenia, unspecified: Secondary | ICD-10-CM | POA: Insufficient documentation

## 2022-03-15 DIAGNOSIS — D649 Anemia, unspecified: Secondary | ICD-10-CM

## 2022-03-15 DIAGNOSIS — E538 Deficiency of other specified B group vitamins: Secondary | ICD-10-CM

## 2022-03-15 LAB — CBC WITH DIFFERENTIAL/PLATELET
Abs Immature Granulocytes: 0.01 10*3/uL (ref 0.00–0.07)
Basophils Absolute: 0 10*3/uL (ref 0.0–0.1)
Basophils Relative: 1 %
Eosinophils Absolute: 0.1 10*3/uL (ref 0.0–0.5)
Eosinophils Relative: 1 %
HCT: 38.9 % (ref 36.0–46.0)
Hemoglobin: 12.5 g/dL (ref 12.0–15.0)
Immature Granulocytes: 0 %
Lymphocytes Relative: 43 %
Lymphs Abs: 2.1 10*3/uL (ref 0.7–4.0)
MCH: 31.5 pg (ref 26.0–34.0)
MCHC: 32.1 g/dL (ref 30.0–36.0)
MCV: 98 fL (ref 80.0–100.0)
Monocytes Absolute: 0.4 10*3/uL (ref 0.1–1.0)
Monocytes Relative: 8 %
Neutro Abs: 2.4 10*3/uL (ref 1.7–7.7)
Neutrophils Relative %: 47 %
Platelets: 179 10*3/uL (ref 150–400)
RBC: 3.97 MIL/uL (ref 3.87–5.11)
RDW: 11.9 % (ref 11.5–15.5)
WBC: 5 10*3/uL (ref 4.0–10.5)
nRBC: 0 % (ref 0.0–0.2)

## 2022-03-16 ENCOUNTER — Inpatient Hospital Stay: Payer: Medicaid Other

## 2022-03-17 ENCOUNTER — Inpatient Hospital Stay (HOSPITAL_BASED_OUTPATIENT_CLINIC_OR_DEPARTMENT_OTHER): Payer: Medicaid Other | Admitting: Oncology

## 2022-03-17 ENCOUNTER — Encounter: Payer: Self-pay | Admitting: Oncology

## 2022-03-17 DIAGNOSIS — Z8041 Family history of malignant neoplasm of ovary: Secondary | ICD-10-CM

## 2022-03-17 DIAGNOSIS — D708 Other neutropenia: Secondary | ICD-10-CM | POA: Diagnosis not present

## 2022-03-17 DIAGNOSIS — D509 Iron deficiency anemia, unspecified: Secondary | ICD-10-CM

## 2022-03-17 DIAGNOSIS — Z803 Family history of malignant neoplasm of breast: Secondary | ICD-10-CM | POA: Diagnosis not present

## 2022-03-18 ENCOUNTER — Encounter: Payer: Self-pay | Admitting: Oncology

## 2022-03-18 NOTE — Progress Notes (Signed)
I connected with Adriana Kerr on 03/18/22 at  3:00 PM EST by telephone visit and verified that I am speaking with the correct person using two identifiers.   I discussed the limitations, risks, security and privacy concerns of performing an evaluation and management service by telemedicine and the availability of in-person appointments. I also discussed with the patient that there may be a patient responsible charge related to this service. The patient expressed understanding and agreed to proceed.  Other persons participating in the visit and their role in the encounter:  none  Patient's location:  home Provider's location:  work  Risk analyst Complaint: Routine follow-up of leukopenia  History of present illness:  Patient is a 22 year old female with a history of iron deficiency anemia during pregnancy for which she received IV iron.  Subsequently she had some transient leukopenia/neutropenia which is being monitored.  She has not required a bone marrow biopsy for the same.   Patient has generalized body aches and arthralgias.  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  She also has an upcoming appointment with rheumatology.  Interval history symptoms of palpitations and fatigue are somewhat improved after starting on propranolol for POTS.  She still has some myalgias and arthralgias for which she is waiting to see rheumatology.   Review of Systems  Constitutional:  Positive for malaise/fatigue. Negative for chills, fever and weight loss.  HENT:  Negative for congestion, ear discharge and nosebleeds.   Eyes:  Negative for blurred vision.  Respiratory:  Negative for cough, hemoptysis, sputum production, shortness of breath and wheezing.   Cardiovascular:  Negative for chest pain, palpitations, orthopnea and claudication.  Gastrointestinal:  Negative for abdominal pain, blood in stool, constipation, diarrhea, heartburn, melena, nausea and vomiting.   Genitourinary:  Negative for dysuria, flank pain, frequency, hematuria and urgency.  Musculoskeletal:  Positive for joint pain. Negative for back pain and myalgias.  Skin:  Negative for rash.  Neurological:  Negative for dizziness, tingling, focal weakness, seizures, weakness and headaches.  Endo/Heme/Allergies:  Does not bruise/bleed easily.  Psychiatric/Behavioral:  Negative for depression and suicidal ideas. The patient does not have insomnia.     Allergies  Allergen Reactions   Amoxicillin Hives   Augmentin [Amoxicillin-Pot Clavulanate] Rash   Codeine Nausea And Vomiting   Flexeril [Cyclobenzaprine] Tinitus    Agitation and disorientation, muscle cramps   Tylenol With Codeine #3 [Acetaminophen-Codeine] Other (See Comments)    emesis   Penicillins Hives    Happened while on amoxicillin, ER MD said she should avoid all penicillin     Past Medical History:  Diagnosis Date   Anxiety    Depression    Migraines    PONV (postoperative nausea and vomiting)    Postural orthostatic tachycardia syndrome     Past Surgical History:  Procedure Laterality Date   ADENOIDECTOMY     ELBOW SURGERY     KNEE SURGERY     TONSILLECTOMY      Social History   Socioeconomic History   Marital status: Married    Spouse name: Thurmond Butts    Number of children: Not on file   Years of education: Not on file   Highest education level: Not on file  Occupational History   Not on file  Tobacco Use   Smoking status: Never    Passive exposure: Yes   Smokeless tobacco: Never  Vaping Use   Vaping Use: Never used  Substance and Sexual Activity  Alcohol use: No   Drug use: Yes    Types: Marijuana    Comment: Last use 04/29/20   Sexual activity: Yes    Birth control/protection: None  Other Topics Concern   Not on file  Social History Narrative   Not on file   Social Determinants of Health   Financial Resource Strain: Not on file  Food Insecurity: Not on file  Transportation Needs: Not on  file  Physical Activity: Not on file  Stress: Not on file  Social Connections: Not on file  Intimate Partner Violence: Not on file    Family History  Problem Relation Age of Onset   Diabetes type II Paternal Grandfather    Ovarian cancer Maternal Grandmother    Migraines Maternal Grandmother    Breast cancer Paternal Grandmother 30   Ulcers Mother    Fibromyalgia Mother    Migraines Mother    Drug abuse Mother    Thyroid disease Father    Alcohol abuse Father    Migraines Sister    Turner syndrome Maternal Aunt    Crohn's disease Maternal Aunt    Stroke Maternal Aunt    Arthritis Maternal Aunt      Current Outpatient Medications:    cyanocobalamin (VITAMIN B12) 1000 MCG/ML injection, Inject 1 mL (1,000 mcg total) into the muscle every 30 (thirty) days., Disp: 1 mL, Rfl: 11   escitalopram (LEXAPRO) 5 MG tablet, Take 1 tablet by mouth daily., Disp: , Rfl:    ibuprofen (ADVIL) 600 MG tablet, Take 1 tablet (600 mg total) by mouth every 6 (six) hours as needed., Disp: 30 tablet, Rfl: 0   ondansetron (ZOFRAN) 8 MG tablet, Take by mouth., Disp: , Rfl:    propranolol (INDERAL) 10 MG tablet, Take 10 mg by mouth 2 (two) times daily., Disp: , Rfl:    Syringe/Needle, Disp, (SYRINGE 3CC/25GX1") 25G X 1" 3 ML MISC, 1 Syringe by Does not apply route every 30 (thirty) days., Disp: 12 each, Rfl: 0  No results found.  No images are attached to the encounter.      Latest Ref Rng & Units 05/31/2020    2:14 PM  CMP  Glucose 70 - 99 mg/dL 81   BUN 6 - 20 mg/dL 23   Creatinine 9.38 - 1.00 mg/dL 1.82   Sodium 993 - 716 mmol/L 138   Potassium 3.5 - 5.1 mmol/L 3.6   Chloride 98 - 111 mmol/L 104   CO2 22 - 32 mmol/L 24   Calcium 8.9 - 10.3 mg/dL 9.3   Total Protein 6.5 - 8.1 g/dL 7.7   Total Bilirubin 0.3 - 1.2 mg/dL 0.5   Alkaline Phos 38 - 126 U/L 126   AST 15 - 41 U/L 20   ALT 0 - 44 U/L 17       Latest Ref Rng & Units 03/15/2022    2:37 PM  CBC  WBC 4.0 - 10.5 K/uL 5.0    Hemoglobin 12.0 - 15.0 g/dL 96.7   Hematocrit 89.3 - 46.0 % 38.9   Platelets 150 - 400 K/uL 179      Assessment and plan: Patient is a 22 year old female who is here for routine follow-up of leukopenia  Leukopenia has been waxing and waning with a white cell count that fluctuates between 3-5.  Presently it is 5.  Differential in the past that showed mild neutropenia with no clear decreasing trend.  No recurrent infections.  She does not require a bone marrow biopsy for this.  She also has a history of iron deficiency anemia which I will follow-up every 3 months and see her back in 6 months.  Follow-up instructions: As above  I discussed the assessment and treatment plan with the patient. The patient was provided an opportunity to ask questions and all were answered. The patient agreed with the plan and demonstrated an understanding of the instructions.   The patient was advised to call back or seek an in-person evaluation if the symptoms worsen or if the condition fails to improve as anticipated.  I provided 11 minutes of non face-to-face telephone visit time during this encounter. Time spent in reviewing labs and coordinating care for the patient  Visit Diagnosis: 1. Other neutropenia ([place])   2. Iron deficiency anemia, unspecified iron deficiency anemia type     Dr. Randa Evens, MD, MPH [facility] Tel- [phone] 03/18/2022 12:06 PM

## 2022-06-26 ENCOUNTER — Encounter: Payer: Self-pay | Admitting: Oncology

## 2022-06-26 ENCOUNTER — Inpatient Hospital Stay: Payer: Medicaid Other | Attending: Oncology

## 2022-06-26 DIAGNOSIS — D708 Other neutropenia: Secondary | ICD-10-CM | POA: Insufficient documentation

## 2022-06-26 DIAGNOSIS — D509 Iron deficiency anemia, unspecified: Secondary | ICD-10-CM | POA: Insufficient documentation

## 2022-06-26 LAB — CBC
HCT: 40.1 % (ref 36.0–46.0)
Hemoglobin: 13.5 g/dL (ref 12.0–15.0)
MCH: 31.1 pg (ref 26.0–34.0)
MCHC: 33.7 g/dL (ref 30.0–36.0)
MCV: 92.4 fL (ref 80.0–100.0)
Platelets: 211 10*3/uL (ref 150–400)
RBC: 4.34 MIL/uL (ref 3.87–5.11)
RDW: 11.4 % — ABNORMAL LOW (ref 11.5–15.5)
WBC: 3.1 10*3/uL — ABNORMAL LOW (ref 4.0–10.5)
nRBC: 0 % (ref 0.0–0.2)

## 2022-06-26 LAB — IRON AND TIBC
Iron: 132 ug/dL (ref 28–170)
Saturation Ratios: 41 % — ABNORMAL HIGH (ref 10.4–31.8)
TIBC: 319 ug/dL (ref 250–450)
UIBC: 187 ug/dL

## 2022-06-26 LAB — FERRITIN: Ferritin: 156 ng/mL (ref 11–307)

## 2022-09-22 ENCOUNTER — Other Ambulatory Visit: Payer: Medicaid Other

## 2022-09-22 ENCOUNTER — Ambulatory Visit: Payer: Medicaid Other | Admitting: Oncology

## 2022-09-29 ENCOUNTER — Other Ambulatory Visit: Payer: Medicaid Other

## 2022-09-29 ENCOUNTER — Ambulatory Visit: Payer: Medicaid Other | Admitting: Oncology

## 2022-10-06 ENCOUNTER — Encounter: Payer: Self-pay | Admitting: Oncology

## 2022-10-06 ENCOUNTER — Inpatient Hospital Stay: Payer: Medicaid Other | Attending: Oncology

## 2022-10-06 ENCOUNTER — Inpatient Hospital Stay (HOSPITAL_BASED_OUTPATIENT_CLINIC_OR_DEPARTMENT_OTHER): Payer: Medicaid Other | Admitting: Oncology

## 2022-10-06 VITALS — BP 111/82 | HR 64 | Temp 97.4°F | Resp 18 | Ht 64.0 in | Wt 118.4 lb

## 2022-10-06 DIAGNOSIS — D72819 Decreased white blood cell count, unspecified: Secondary | ICD-10-CM | POA: Diagnosis not present

## 2022-10-06 DIAGNOSIS — E538 Deficiency of other specified B group vitamins: Secondary | ICD-10-CM | POA: Diagnosis not present

## 2022-10-06 DIAGNOSIS — Z803 Family history of malignant neoplasm of breast: Secondary | ICD-10-CM | POA: Insufficient documentation

## 2022-10-06 DIAGNOSIS — Z8041 Family history of malignant neoplasm of ovary: Secondary | ICD-10-CM | POA: Diagnosis not present

## 2022-10-06 DIAGNOSIS — D509 Iron deficiency anemia, unspecified: Secondary | ICD-10-CM | POA: Diagnosis present

## 2022-10-06 DIAGNOSIS — D708 Other neutropenia: Secondary | ICD-10-CM

## 2022-10-06 LAB — CBC
HCT: 36.7 % (ref 36.0–46.0)
Hemoglobin: 12.1 g/dL (ref 12.0–15.0)
MCH: 31.1 pg (ref 26.0–34.0)
MCHC: 33 g/dL (ref 30.0–36.0)
MCV: 94.3 fL (ref 80.0–100.0)
Platelets: 174 10*3/uL (ref 150–400)
RBC: 3.89 MIL/uL (ref 3.87–5.11)
RDW: 11.8 % (ref 11.5–15.5)
WBC: 3.3 10*3/uL — ABNORMAL LOW (ref 4.0–10.5)
nRBC: 0 % (ref 0.0–0.2)

## 2022-10-06 LAB — IRON AND TIBC
Iron: 120 ug/dL (ref 28–170)
Saturation Ratios: 38 % — ABNORMAL HIGH (ref 10.4–31.8)
TIBC: 314 ug/dL (ref 250–450)
UIBC: 194 ug/dL

## 2022-10-06 LAB — FERRITIN: Ferritin: 111 ng/mL (ref 11–307)

## 2022-10-06 MED ORDER — CYANOCOBALAMIN 1000 MCG/ML IJ SOLN: 1000.0000 ug | INTRAMUSCULAR | 11 refills | Status: AC

## 2022-10-06 MED ORDER — "SYRINGE 25G X 1"" 3 ML MISC": 1.0000 | 0 refills | Status: AC

## 2022-10-06 NOTE — Addendum Note (Signed)
Addended by: Corene Cornea on: 10/06/2022 02:09 PM   Modules accepted: Orders

## 2022-10-06 NOTE — Addendum Note (Signed)
Addended by: Corene Cornea on: 10/06/2022 01:27 PM   Modules accepted: Orders

## 2022-10-06 NOTE — Progress Notes (Signed)
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  Subsequently she had some transient leukopenia/neutropenia which is being monitored.  She has not required a bone marrow biopsy for the same.    Patient has generalized body aches and arthralgias.  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  Interval history-patient reports feeling better after starting propranolol for her POTS.  Heart rate is under better control and she is able to do her day-to-day tasks without significant fatigue.  She continues to take her B12 injections monthly which helped her as well.  ECOG PS- 0 Pain scale- 0   Review of systems- Review of Systems  Constitutional:  Negative for chills, fever, malaise/fatigue and weight loss.  HENT:  Negative for congestion, ear discharge and nosebleeds.   Eyes:  Negative for blurred vision.  Respiratory:  Negative for cough, hemoptysis, sputum production, shortness of breath and wheezing.   Cardiovascular:  Negative for chest pain, palpitations, orthopnea and claudication.  Gastrointestinal:  Negative for abdominal pain, blood in stool, constipation, diarrhea, heartburn, melena, nausea and vomiting.  Genitourinary:  Negative for dysuria, flank pain, frequency, hematuria and urgency.  Musculoskeletal:   Negative for back pain, joint pain and myalgias.  Skin:  Negative for rash.  Neurological:  Negative for dizziness, tingling, focal weakness, seizures, weakness and headaches.  Endo/Heme/Allergies:  Does not bruise/bleed easily.  Psychiatric/Behavioral:  Negative for depression and suicidal ideas. The patient does not have insomnia.       Allergies  Allergen Reactions   Amoxicillin Hives   Augmentin [Amoxicillin-Pot Clavulanate] Rash   Codeine Nausea And Vomiting   Flexeril [Cyclobenzaprine] Tinitus    Agitation and disorientation, muscle cramps   Tylenol With Codeine #3 [Acetaminophen-Codeine] Other (See Comments)    emesis   Penicillins Hives    Happened while on amoxicillin, ER MD said she should avoid all penicillin      Past Medical History:  Diagnosis Date   Anxiety    Depression    Migraines    PONV (postoperative nausea and vomiting)    Postural orthostatic tachycardia syndrome      Past Surgical History:  Procedure Laterality Date   ADENOIDECTOMY     ELBOW SURGERY     KNEE SURGERY     TONSILLECTOMY      Social History   Socioeconomic History   Marital status: Married    Spouse name: [name]    Number of children: Not on file   Years of education: Not on file   Highest education level: Not on file  Occupational History   Not on file  Tobacco Use   Smoking status: Never    Passive exposure: Yes   Smokeless tobacco: Never  Vaping Use   Vaping status: Never Used  Substance and Sexual Activity   Alcohol use: No   Drug use: Yes    Types: Marijuana    Comment:  Last use 04/29/20   Sexual activity: Yes    Birth control/protection: None  Other Topics Concern   Not on file  Social History Narrative   Not on file   Social Determinants of Health   Financial Resource Strain: Not on file  Food Insecurity: Not on file  Transportation Needs: Not on file  Physical Activity: Not on file  Stress: Not on file  Social Connections: Not on file  Intimate  Partner Violence: Not on file    Family History  Problem Relation Age of Onset   Diabetes type II Paternal Grandfather    Ovarian cancer Maternal Grandmother    Migraines Maternal Grandmother    Breast cancer Paternal Grandmother 80   Ulcers Mother    Fibromyalgia Mother    Migraines Mother    Drug abuse Mother    Thyroid disease Father    Alcohol abuse Father    Migraines Sister    Turner syndrome Maternal Aunt    Crohn's disease Maternal Aunt    Stroke Maternal Aunt    Arthritis Maternal Aunt      Current Outpatient Medications:    cyanocobalamin (VITAMIN B12) 1000 MCG/ML injection, Inject 1 mL (1,000 mcg total) into the muscle every 30 (thirty) days., Disp: 1 mL, Rfl: 11   escitalopram (LEXAPRO) 5 MG tablet, Take 1 tablet by mouth daily., Disp: , Rfl:    ondansetron (ZOFRAN) 8 MG tablet, Take by mouth., Disp: , Rfl:    propranolol (INDERAL) 10 MG tablet, Take 10 mg by mouth 2 (two) times daily., Disp: , Rfl:    Syringe/Needle, Disp, (SYRINGE 3CC/25GX1") 25G X 1" 3 ML MISC, 1 Syringe by Does not apply route every 30 (thirty) days., Disp: 12 each, Rfl: 0   ibuprofen (ADVIL) 600 MG tablet, Take 1 tablet (600 mg total) by mouth every 6 (six) hours as needed. (Patient not taking: Reported on 10/06/2022), Disp: 30 tablet, Rfl: 0  Physical exam:  Vitals:   10/06/22 1114  BP: 111/82  Pulse: 64  Resp: 18  Temp: (!) 97.4 F (36.3 C)  TempSrc: Tympanic  SpO2: 100%  Weight: 118 lb 6.4 oz (53.7 kg)  Height: 5\' 4"  (1.626 m)   Physical Exam Cardiovascular:     Rate and Rhythm: Normal rate and regular rhythm.     Heart sounds: Normal heart sounds.  Pulmonary:     Effort: Pulmonary effort is normal.  Skin:    General: Skin is warm and dry.  Neurological:     Mental Status: She is alert and oriented to person, place, and time.         Latest Ref Rng & Units 05/31/2020    2:14 PM  CMP  Glucose 70 - 99 mg/dL 81   BUN 6 - 20 mg/dL 23   Creatinine 5.78 - 1.00 mg/dL 4.69    Sodium 629 - 528 mmol/L 138   Potassium 3.5 - 5.1 mmol/L 3.6   Chloride 98 - 111 mmol/L 104   CO2 22 - 32 mmol/L 24   Calcium 8.9 - 10.3 mg/dL 9.3   Total Protein 6.5 - 8.1 g/dL 7.7   Total Bilirubin 0.3 - 1.2 mg/dL 0.5   Alkaline Phos 38 - 126 U/L 126   AST 15 - 41 U/L 20   ALT 0 - 44 U/L 17       Latest Ref Rng & Units 10/06/2022   11:06 AM  CBC  WBC 4.0 - 10.5 K/uL 3.3   Hemoglobin 12.0 -  15.0 g/dL 34.7   Hematocrit 42.5 - 46.0 % 36.7   Platelets 150 - 400 K/uL 174     Assessment and plan- Patient is a 22 y.o. female who is here for routine follow-up of leukopenia and anemia  Leukopenia: White cell count waxes and wanes between 3-4.Differential has mainly shown occasional neutropenia.  This is likely benign and does not require any further follow-up  Iron deficiency anemia: Patient is not presently anemic and last received IV iron about 1 year ago.  We will repeat CBC ferritin and iron studies in 6 months in 1 year and I will see her back in 1 year.  History of B12 deficiency: We will send her prescriptions for B12 syringes and injections.  Repeat B12 levels again in 1 year   Visit Diagnosis 1. Iron deficiency anemia, unspecified iron deficiency anemia type   2. B12 deficiency      Dr. Owens Shark, MD, MPH [facility] at [facility] [phone] 10/06/2022 1:17 PM

## 2023-02-22 ENCOUNTER — Encounter: Payer: Self-pay | Admitting: Oncology

## 2023-02-22 NOTE — Telephone Encounter (Signed)
 Error

## 2023-04-10 ENCOUNTER — Inpatient Hospital Stay: Payer: Medicaid Other

## 2023-10-04 ENCOUNTER — Other Ambulatory Visit: Payer: Self-pay

## 2023-10-04 DIAGNOSIS — E538 Deficiency of other specified B group vitamins: Secondary | ICD-10-CM

## 2023-10-05 ENCOUNTER — Inpatient Hospital Stay: Payer: Medicaid Other | Attending: Oncology

## 2023-10-05 ENCOUNTER — Encounter: Payer: Self-pay | Admitting: Oncology

## 2023-10-05 ENCOUNTER — Inpatient Hospital Stay (HOSPITAL_BASED_OUTPATIENT_CLINIC_OR_DEPARTMENT_OTHER): Payer: Medicaid Other | Admitting: Oncology

## 2023-10-05 VITALS — BP 94/65 | HR 67 | Temp 97.0°F | Resp 18 | Ht 64.0 in | Wt 120.5 lb

## 2023-10-05 DIAGNOSIS — Z862 Personal history of diseases of the blood and blood-forming organs and certain disorders involving the immune mechanism: Secondary | ICD-10-CM | POA: Diagnosis not present

## 2023-10-05 DIAGNOSIS — Z803 Family history of malignant neoplasm of breast: Secondary | ICD-10-CM

## 2023-10-05 DIAGNOSIS — E538 Deficiency of other specified B group vitamins: Secondary | ICD-10-CM | POA: Insufficient documentation

## 2023-10-05 DIAGNOSIS — D72819 Decreased white blood cell count, unspecified: Secondary | ICD-10-CM | POA: Diagnosis not present

## 2023-10-05 DIAGNOSIS — D509 Iron deficiency anemia, unspecified: Secondary | ICD-10-CM

## 2023-10-05 DIAGNOSIS — Z8639 Personal history of other endocrine, nutritional and metabolic disease: Secondary | ICD-10-CM

## 2023-10-05 DIAGNOSIS — D709 Neutropenia, unspecified: Secondary | ICD-10-CM | POA: Insufficient documentation

## 2023-10-05 DIAGNOSIS — Z8041 Family history of malignant neoplasm of ovary: Secondary | ICD-10-CM

## 2023-10-05 LAB — CBC
HCT: 38.5 % (ref 36.0–46.0)
Hemoglobin: 12.7 g/dL (ref 12.0–15.0)
MCH: 31 pg (ref 26.0–34.0)
MCHC: 33 g/dL (ref 30.0–36.0)
MCV: 93.9 fL (ref 80.0–100.0)
Platelets: 198 K/uL (ref 150–400)
RBC: 4.1 MIL/uL (ref 3.87–5.11)
RDW: 11.9 % (ref 11.5–15.5)
WBC: 3.6 K/uL — ABNORMAL LOW (ref 4.0–10.5)
nRBC: 0 % (ref 0.0–0.2)

## 2023-10-05 LAB — VITAMIN B12: Vitamin B-12: 902 pg/mL (ref 180–914)

## 2023-10-05 LAB — IRON AND TIBC
Iron: 108 ug/dL (ref 28–170)
Saturation Ratios: 32 % — ABNORMAL HIGH (ref 10.4–31.8)
TIBC: 333 ug/dL (ref 250–450)
UIBC: 225 ug/dL

## 2023-10-05 LAB — FERRITIN: Ferritin: 63 ng/mL (ref 11–307)

## 2023-10-05 MED ORDER — BD SAFETYGLIDE SYRINGE/NEEDLE 25G X 1" 3 ML MISC: 0 refills | Status: AC

## 2023-10-05 MED ORDER — CYANOCOBALAMIN 1000 MCG/ML IJ SOLN: 1000.0000 ug | INTRAMUSCULAR | 0 refills | Status: AC

## 2023-10-05 NOTE — Progress Notes (Signed)
 Patient doing well with no new or acute concerns today.

## 2023-10-05 NOTE — Progress Notes (Signed)
 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  Subsequently she had some transient leukopenia/neutropenia which is being monitored.  She has not required a bone marrow biopsy for the same.    Patient has generalized body aches and arthralgias.  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    Interval history-patient feels better after she was started on propranolol for symptoms of POTS which have been under better control.  She feels more energetic and is able to carry on her ADLs and IADLs without feeling significant fatigue.  ECOG PS- 0 Pain scale- 0   Review of systems- Review of Systems  Constitutional:  Negative for chills, fever, malaise/fatigue and weight loss.  HENT:  Negative for congestion, ear discharge and nosebleeds.   Eyes:  Negative for blurred vision.  Respiratory:  Negative for cough, hemoptysis, sputum production, shortness of breath and wheezing.   Cardiovascular:  Negative for chest pain, palpitations, orthopnea and claudication.  Gastrointestinal:  Negative for abdominal pain, blood in stool, constipation, diarrhea, heartburn, melena, nausea and vomiting.  Genitourinary:  Negative for dysuria, flank pain, frequency, hematuria and urgency.  Musculoskeletal:  Negative for back pain, joint pain and myalgias.  Skin:   Negative for rash.  Neurological:  Negative for dizziness, tingling, focal weakness, seizures, weakness and headaches.  Endo/Heme/Allergies:  Does not bruise/bleed easily.  Psychiatric/Behavioral:  Negative for depression and suicidal ideas. The patient does not have insomnia.       Allergies  Allergen Reactions   Amoxicillin  Hives   Augmentin  [Amoxicillin -Pot Clavulanate] Rash   Codeine Nausea And Vomiting   Flexeril [Cyclobenzaprine] Tinitus    Agitation and disorientation, muscle cramps   Tylenol  With Codeine #3 [Acetaminophen -Codeine] Other (See Comments)    emesis   Penicillins Hives    Happened while on amoxicillin , ER MD said she should avoid all penicillin      Past Medical History:  Diagnosis Date   Anxiety    Depression    Migraines    PONV (postoperative nausea and vomiting)    Postural orthostatic tachycardia syndrome      Past Surgical History:  Procedure Laterality Date   ADENOIDECTOMY     ELBOW SURGERY     KNEE SURGERY     TONSILLECTOMY      Social History   Socioeconomic History   Marital status: Married    Spouse name: Bernardino    Number of children: Not on file   Years of education: Not on file   Highest education level: Not on file  Occupational History   Not on file  Tobacco Use   Smoking status: Never    Passive exposure: Yes   Smokeless tobacco: Never  Vaping Use   Vaping status: Never Used  Substance and Sexual Activity   Alcohol use: No   Drug use: Yes    Types: Marijuana    Comment: Last use 04/29/20   Sexual  activity: Yes    Birth control/protection: None  Other Topics Concern   Not on file  Social History Narrative   Not on file   Social Drivers of Health   Financial Resource Strain: Not on file  Food Insecurity: Not on file  Transportation Needs: Not on file  Physical Activity: Not on file  Stress: Not on file  Social Connections: Not on file  Intimate Partner Violence: Not on file    Family History  Problem  Relation Age of Onset   Diabetes type II Paternal Grandfather    Ovarian cancer Maternal Grandmother    Migraines Maternal Grandmother    Breast cancer Paternal Grandmother 3   Ulcers Mother    Fibromyalgia Mother    Migraines Mother    Drug abuse Mother    Thyroid  disease Father    Alcohol abuse Father    Migraines Sister    Turner syndrome Maternal Aunt    Crohn's disease Maternal Aunt    Stroke Maternal Aunt    Arthritis Maternal Aunt      Current Outpatient Medications:    cyanocobalamin  (VITAMIN B12) 1000 MCG/ML injection, Inject 1 mL (1,000 mcg total) into the muscle every 30 (thirty) days., Disp: 1 mL, Rfl: 11   cyanocobalamin  (VITAMIN B12) 1000 MCG/ML injection, Inject 1 mL (1,000 mcg total) into the muscle every 30 (thirty) days., Disp: 6 mL, Rfl: 0   escitalopram (LEXAPRO) 5 MG tablet, Take 1 tablet by mouth daily., Disp: , Rfl:    ondansetron  (ZOFRAN ) 8 MG tablet, Take by mouth., Disp: , Rfl:    propranolol (INDERAL) 10 MG tablet, Take 10 mg by mouth 2 (two) times daily., Disp: , Rfl:    SYRINGE-NEEDLE, DISP, 3 ML (BD SAFETYGLIDE SYRINGE/NEEDLE) 25G X 1 3 ML MISC, Use the needle for M injection once a month., Disp: 30 each, Rfl: 0   Syringe/Needle, Disp, (SYRINGE 3CC/25GX1) 25G X 1 3 ML MISC, 1 Syringe by Does not apply route every 30 (thirty) days., Disp: 12 each, Rfl: 0   fludrocortisone (FLORINEF) 0.1 MG tablet, Take 100 mcg by mouth daily. (Patient not taking: Reported on 10/05/2023), Disp: , Rfl:    ibuprofen  (ADVIL ) 600 MG tablet, Take 1 tablet (600 mg total) by mouth every 6 (six) hours as needed. (Patient not taking: Reported on 10/05/2023), Disp: 30 tablet, Rfl: 0   naproxen (NAPROSYN) 500 MG tablet, Take 500 mg by mouth. (Patient not taking: Reported on 10/05/2023), Disp: , Rfl:    Vitamin D, Ergocalciferol, (DRISDOL) 1.25 MG (50000 UNIT) CAPS capsule, Take 50,000 Units by mouth once a week. (Patient not taking: Reported on 10/05/2023), Disp: , Rfl:   Physical  exam:  Vitals:   10/05/23 0948  BP: 94/65  Pulse: 67  Resp: 18  Temp: (!) 97 F (36.1 C)  TempSrc: Tympanic  SpO2: 100%  Weight: 120 lb 8 oz (54.7 kg)  Height: 5' 4 (1.626 m)   Physical Exam Cardiovascular:     Rate and Rhythm: Normal rate and regular rhythm.     Heart sounds: Normal heart sounds.  Pulmonary:     Effort: Pulmonary effort is normal.     Breath sounds: Normal breath sounds.  Skin:    General: Skin is warm and dry.  Neurological:     Mental Status: She is alert and oriented to person, place, and time.      I have personally reviewed labs listed below:    Latest Ref Rng & Units 05/31/2020  2:14 PM  CMP  Glucose 70 - 99 mg/dL 81   BUN 6 - 20 mg/dL 23   Creatinine 9.55 - 1.00 mg/dL 9.23   Sodium 864 - 854 mmol/L 138   Potassium 3.5 - 5.1 mmol/L 3.6   Chloride 98 - 111 mmol/L 104   CO2 22 - 32 mmol/L 24   Calcium 8.9 - 10.3 mg/dL 9.3   Total Protein 6.5 - 8.1 g/dL 7.7   Total Bilirubin 0.3 - 1.2 mg/dL 0.5   Alkaline Phos 38 - 126 U/L 126   AST 15 - 41 U/L 20   ALT 0 - 44 U/L 17       Latest Ref Rng & Units 10/05/2023    9:30 AM  CBC  WBC 4.0 - 10.5 K/uL 3.6   Hemoglobin 12.0 - 15.0 g/dL 87.2   Hematocrit 63.9 - 46.0 % 38.5   Platelets 150 - 400 K/uL 198      Assessment and plan- Patient is a 23 y.o. female here for routine follow-up of following issues:  Leukopenia: Nonspecific with mild neutropenia.  White blood cell count has been fluctuating between 3-5 over the last few years without a clear downward trend.  This is likely benign and does not require any further workup or bone marrow biopsy.  History of iron deficiency: Patient has not required IV iron since 2023.  Hemoglobin is presently within normal limits.  History of B12 deficiency: She is on monthly B12 injections and we will give her 61-month supply for the same.  Patient can follow-up with her primary care provider at this time and does not require any follow-up with me.  Future  B12 prescriptions will need to come from PCP as well.   Visit Diagnosis 1. Leukopenia, unspecified type   2. History of iron deficiency      Dr. Annah Skene, MD, MPH [facility] at [facility] [phone] 10/05/2023 12:18 PM

## 2023-10-15 ENCOUNTER — Ambulatory Visit
Admission: RE | Admit: 2023-10-15 | Discharge: 2023-10-15 | Disposition: A | Discharge status: Home / Self Care | Payer: Self-pay | Source: Ambulatory Visit | Attending: Emergency Medicine | Admitting: Emergency Medicine

## 2023-10-15 VITALS — BP 111/73 | HR 78 | Temp 98.3°F | Resp 18

## 2023-10-15 DIAGNOSIS — M79671 Pain in right foot: Secondary | ICD-10-CM | POA: Diagnosis not present

## 2023-10-15 NOTE — ED Provider Notes (Signed)
 Adriana Kerr    CSN: [id number] Arrival date & time: 10/15/23  1053      History   Chief Complaint Chief Complaint  Patient presents with   Foot Pain    Yesterday while walking in my kitchen I stepped down the same as usual and my foot popped. Near where the 3rd and 4th toe connect to my foot. It hasn't swollen. But I did prop it up and ice. But still hurts deep. More than a bruisewhen I bear weight - Entered by patient    HPI Adriana Kerr is a 23 y.o. female.   Patient presents for evaluation of pain at the midfoot following the fourth toe beginning 1 day ago.  Denies injury or trauma but endorses symptoms began after she stepped down onto the foot, endorses that this was not harder than normal and the foot was not displaced.  Has had constant pain since described as a arthritis ache with intermittent sharp stabbing pain.  Able to complete range of motion and able to bear weight but symptoms are exacerbated with movement.  Denies change in activity recently.  Endorses numbness at baseline due to prior knee surgery on the right side.  Has attempted Tylenol , ice and elevation.    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 Authorizing Provider  cyanocobalamin  (VITAMIN B12) 1000 MCG/ML injection Inject 1 mL (1,000 mcg total) into the muscle every 30 (thirty) days. 10/06/22   Melanee Annah BROCKS, MD  cyanocobalamin  (VITAMIN B12) 1000 MCG/ML injection Inject 1 mL (1,000 mcg total) into the muscle every 30 (thirty) days. 10/05/23   Rao, Archana C, MD  escitalopram (LEXAPRO) 5 MG tablet Take 1 tablet by mouth daily. 08/17/21 10/05/23  [provider]  fludrocortisone (FLORINEF) 0.1 MG tablet Take 100 mcg by mouth daily. Patient not taking: Reported on 10/05/2023    [provider]  ibuprofen  (ADVIL ) 600 MG tablet Take 1 tablet (600 mg total) by mouth every 6 (six) hours as needed. Patient not taking: Reported on 10/05/2023 07/22/21   Corlis Burnard DEL, NP  naproxen (NAPROSYN) 500  MG tablet Take 500 mg by mouth. Patient not taking: Reported on 10/05/2023 08/12/14   [provider]  ondansetron  (ZOFRAN ) 8 MG tablet Take by mouth. 06/20/21   [provider]  propranolol (INDERAL) 10 MG tablet Take 10 mg by mouth 2 (two) times daily.    [provider]  SYRINGE-NEEDLE, DISP, 3 ML (BD SAFETYGLIDE SYRINGE/NEEDLE) 25G X 1 3 ML MISC Use the needle for M injection once a month. 10/05/23   Melanee Annah BROCKS, MD  Syringe/Needle, Disp, (SYRINGE 3CC/25GX1) 25G X 1 3 ML MISC 1 Syringe by Does not apply route every 30 (thirty) days. 10/06/22   Rao, Archana C, MD  Vitamin D, Ergocalciferol, (DRISDOL) 1.25 MG (50000 UNIT) CAPS capsule Take 50,000 Units by mouth once a week. Patient not taking: Reported on 10/05/2023 11/26/22   [provider]    Family History Family History  Problem Relation  Age of Onset   Diabetes type II Paternal Grandfather    Ovarian cancer Maternal Grandmother    Migraines Maternal Grandmother    Breast cancer Paternal Grandmother 66   Ulcers Mother    Fibromyalgia Mother    Migraines Mother    Drug abuse Mother    Thyroid  disease Father    Alcohol abuse Father    Migraines Sister    Turner syndrome Maternal Aunt    Crohn's disease Maternal Aunt    Stroke Maternal Aunt    Arthritis Maternal Aunt     Social History Social History   Tobacco Use   Smoking status: Never    Passive exposure: Yes   Smokeless tobacco: Never  Vaping Use   Vaping status: Never Used  Substance Use Topics   Alcohol use: No   Drug use: Yes    Types: Marijuana    Comment: Last use 04/29/20     Allergies   Amoxicillin , Augmentin  [amoxicillin -pot clavulanate], Codeine, Flexeril [cyclobenzaprine], Tylenol  with codeine #3 [acetaminophen -codeine], and Penicillins   Review of Systems Review of Systems   Physical Exam Triage Vital Signs ED Triage Vitals  Encounter Vitals Group     BP 10/15/23 1102 111/73     Girls Systolic BP Percentile --      Girls Diastolic BP Percentile --      Boys Systolic BP Percentile --      Boys Diastolic BP Percentile --      Pulse Rate 10/15/23 1102 78     Resp 10/15/23 1102 18     Temp 10/15/23 1102 98.3 F (36.8 C)     Temp Source 10/15/23 1102 Oral     SpO2 10/15/23 1102 98 %     Weight --      Height --      Head Circumference --      Peak Flow --      Pain Score 10/15/23 1106 2     Pain Loc --      Pain Education --      Exclude from Growth Chart --    No data found.  Updated Vital Signs BP 111/73 (BP Location: Left Arm)   Pulse 78   Temp 98.3 F (36.8 C) (Oral)   Resp 18   LMP 09/24/2023 (Approximate)   SpO2 98%   Breastfeeding No   Visual Acuity Right Eye Distance:   Left Eye Distance:   Bilateral Distance:    Right Eye Near:   Left Eye Near:    Bilateral Near:     Physical Exam Constitutional:  Appearance: Normal appearance.  Eyes:     Extraocular Movements: Extraocular movements intact.  Pulmonary:     Effort: Pulmonary effort is normal.  Musculoskeletal:     Comments: Tenderness present at the base of the right fourth metatarsal extending into the midfoot stopping at the ankle without ecchymosis swelling or deformity, 2+ dorsalis pedis pulse, able to bear weight and able to complete range of motion of the toe, sensation intact and capillary refill less than 3  Neurological:     Mental Status: She is alert and oriented to person, place, and time. Mental status is at baseline.      UC Treatments / Results  Labs (all labs ordered are listed, but only abnormal results are displayed) Labs Reviewed - No data to display  EKG   Radiology No results found.  Procedures Procedures (including critical care time)  Medications Ordered in UC Medications - No data to display  Initial Impression / Assessment and Plan / UC Course  I have reviewed the triage vital signs and the nursing notes.  Pertinent labs & imaging results that were available during my care of the patient were reviewed by me and considered in my medical decision making (see chart for details).  Acute right foot pain Deferring imaging as there was no injury, discussed with patient, in agreement with plan, most likely muscular irritation, advised to monitor recommended NSAIDs and RICE, requesting cam boot as she endorses needing to go to work requiring at least 3 to 4 hours of being on the feet, obliged but discussed multiple times to not use at all times to prevent weakness, verbalized understanding, walker referral given to orthopedics but may follow-up with urgent care as needed Final Clinical Impressions(s) / UC Diagnoses   Final diagnoses:  Acute foot pain, right     Discharge Instructions      Today you were evaluated for your foot pain which is most likely an irritation to the tendons and  ligaments that allow for movement  As there was no injury to the foot low suspicion that bone would be broken therefore we have held off on imaging at this time however imaging will be warranted if your symptoms continue to persist without any improvement  You have been given a cam boot for stability and support while completing activity, please do not wear at all times as this will cause weakness to your foot  Take ibuprofen  600 mg every 6 hours consistently to reduce internal inflammation and help with pain, may take Tylenol  additionally  May use ice or heat over the affected area 10 to 15-minute intervals  May elevate whenever sitting and lying for additional comfort or support  If your symptoms continue to persist may follow-up with urgent care or orthopedic specialist whose information is on front page, orthopedic specialist has walk-in clinic   ED Prescriptions   None    PDMP not reviewed this encounter.   Teresa Shelba SAUNDERS, NP 10/15/23 [phone]

## 2023-10-15 NOTE — Discharge Instructions (Signed)
 Today you were evaluated for your foot pain which is most likely an irritation to the tendons and ligaments that allow for movement  As there was no injury to the foot low suspicion that bone would be broken therefore we have held off on imaging at this time however imaging will be warranted if your symptoms continue to persist without any improvement  You have been given a cam boot for stability and support while completing activity, please do not wear at all times as this will cause weakness to your foot  Take ibuprofen  600 mg every 6 hours consistently to reduce internal inflammation and help with pain, may take Tylenol  additionally  May use ice or heat over the affected area 10 to 15-minute intervals  May elevate whenever sitting and lying for additional comfort or support  If your symptoms continue to persist may follow-up with urgent care or orthopedic specialist whose information is on front page, orthopedic specialist has walk-in clinic

## 2023-10-15 NOTE — ED Triage Notes (Signed)
 Patient reports pain in 3rd toe. Patient describes pain as stabbing. Denies injury. Rates 2/10. Patent took Tylenol  last night for pain with no relief.
